# Patient Record
Sex: Female | Born: 1955
Health system: Southern US, Community
[De-identification: ages and names within clinical notes are randomized; demographics above are authoritative.]

## PROBLEM LIST (undated history)

## (undated) DIAGNOSIS — H409 Unspecified glaucoma: Secondary | ICD-10-CM

## (undated) DIAGNOSIS — E119 Type 2 diabetes mellitus without complications: Secondary | ICD-10-CM

## (undated) DIAGNOSIS — E66813 Obesity, class 3: Secondary | ICD-10-CM

## (undated) HISTORY — DX: Obesity, class 3: E66.813

## (undated) HISTORY — DX: Unspecified glaucoma: H40.9

## (undated) HISTORY — PX: COLONOSCOPY: SHX174

## (undated) HISTORY — DX: Type 2 diabetes mellitus without complications: E11.9

## (undated) HISTORY — DX: Morbid (severe) obesity due to excess calories: E66.01

---

## 1987-03-08 HISTORY — PX: KNEE ARTHROSCOPY: SUR90

## 1987-03-08 HISTORY — PX: KNEE SURGERY: SHX244

## 1994-03-07 HISTORY — PX: ABDOMINAL HYSTERECTOMY: SHX81

## 2004-03-07 LAB — HM COLONOSCOPY

## 2010-09-23 ENCOUNTER — Other Ambulatory Visit: Payer: Self-pay | Admitting: Family Medicine

## 2010-09-23 DIAGNOSIS — Z1231 Encounter for screening mammogram for malignant neoplasm of breast: Secondary | ICD-10-CM

## 2010-10-08 ENCOUNTER — Ambulatory Visit: Payer: Self-pay

## 2010-10-15 ENCOUNTER — Ambulatory Visit
Admission: RE | Admit: 2010-10-15 | Discharge: 2010-10-15 | Disposition: A | Discharge status: Home / Self Care | Payer: PRIVATE HEALTH INSURANCE | Source: Ambulatory Visit | Attending: Family Medicine | Admitting: Family Medicine

## 2010-10-15 DIAGNOSIS — Z1231 Encounter for screening mammogram for malignant neoplasm of breast: Secondary | ICD-10-CM

## 2011-06-08 ENCOUNTER — Ambulatory Visit (INDEPENDENT_AMBULATORY_CARE_PROVIDER_SITE_OTHER): Payer: BC Managed Care – PPO | Admitting: Family Medicine

## 2011-06-08 ENCOUNTER — Encounter: Payer: Self-pay | Admitting: Family Medicine

## 2011-06-08 DIAGNOSIS — H9319 Tinnitus, unspecified ear: Secondary | ICD-10-CM

## 2011-06-08 DIAGNOSIS — R7309 Other abnormal glucose: Secondary | ICD-10-CM

## 2011-06-08 DIAGNOSIS — Z0000 Encounter for general adult medical examination without abnormal findings: Secondary | ICD-10-CM

## 2011-06-08 DIAGNOSIS — R739 Hyperglycemia, unspecified: Secondary | ICD-10-CM

## 2011-06-08 DIAGNOSIS — E669 Obesity, unspecified: Secondary | ICD-10-CM

## 2011-06-08 NOTE — Patient Instructions (Signed)
Preventive Care for Adults, Female A healthy lifestyle and preventive care can promote health and wellness. Preventive health guidelines for women include the following key practices.  A routine yearly physical is a good way to check with your caregiver about your health and preventive screening. It is a chance to share any concerns and updates on your health, and to receive a thorough exam.   Visit your dentist for a routine exam and preventive care every 6 months. Brush your teeth twice a day and floss once a day. Good oral hygiene prevents tooth decay and gum disease.   The frequency of eye exams is based on your age, health, family medical history, use of contact lenses, and other factors. Follow your caregiver's recommendations for frequency of eye exams.   Eat a healthy diet. Foods like vegetables, fruits, whole grains, low-fat dairy products, and lean protein foods contain the nutrients you need without too many calories. Decrease your intake of foods high in solid fats, added sugars, and salt. Eat the right amount of calories for you.Get information about a proper diet from your caregiver, if necessary.   Regular physical exercise is one of the most important things you can do for your health. Most adults should get at least 150 minutes of moderate-intensity exercise (any activity that increases your heart rate and causes you to sweat) each week. In addition, most adults need muscle-strengthening exercises on 2 or more days a week.   Maintain a healthy weight. The body mass index (BMI) is a screening tool to identify possible weight problems. It provides an estimate of body fat based on height and weight. Your caregiver can help determine your BMI, and can help you achieve or maintain a healthy weight.For adults 20 years and older:   A BMI below 18.5 is considered underweight.   A BMI of 18.5 to 24.9 is normal.   A BMI of 25 to 29.9 is considered overweight.   A BMI of 30 and above is  considered obese.   Maintain normal blood lipids and cholesterol levels by exercising and minimizing your intake of saturated fat. Eat a balanced diet with plenty of fruit and vegetables. Blood tests for lipids and cholesterol should begin at age 20 and be repeated every 5 years. If your lipid or cholesterol levels are high, you are over 50, or you are at high risk for heart disease, you may need your cholesterol levels checked more frequently.Ongoing high lipid and cholesterol levels should be treated with medicines if diet and exercise are not effective.   If you smoke, find out from your caregiver how to quit. If you do not use tobacco, do not start.   If you are pregnant, do not drink alcohol. If you are breastfeeding, be very cautious about drinking alcohol. If you are not pregnant and choose to drink alcohol, do not exceed 1 drink per day. One drink is considered to be 12 ounces (355 mL) of beer, 5 ounces (148 mL) of wine, or 1.5 ounces (44 mL) of liquor.   Avoid use of street drugs. Do not share needles with anyone. Ask for help if you need support or instructions about stopping the use of drugs.   High blood pressure causes heart disease and increases the risk of stroke. Your blood pressure should be checked at least every 1 to 2 years. Ongoing high blood pressure should be treated with medicines if weight loss and exercise are not effective.   If you are 55 to 58   years old, ask your caregiver if you should take aspirin to prevent strokes.   Diabetes screening involves taking a blood sample to check your fasting blood sugar level. This should be done once every 3 years, after age 45, if you are within normal weight and without risk factors for diabetes. Testing should be considered at a younger age or be carried out more frequently if you are overweight and have at least 1 risk factor for diabetes.   Breast cancer screening is essential preventive care for women. You should practice "breast  self-awareness." This means understanding the normal appearance and feel of your breasts and may include breast self-examination. Any changes detected, no matter how small, should be reported to a caregiver. Women in their 20s and 30s should have a clinical breast exam (CBE) by a caregiver as part of a regular health exam every 1 to 3 years. After age 40, women should have a CBE every year. Starting at age 40, women should consider having a mammography (breast X-ray test) every year. Women who have a family history of breast cancer should talk to their caregiver about genetic screening. Women at a high risk of breast cancer should talk to their caregivers about having magnetic resonance imaging (MRI) and a mammography every year.   The Pap test is a screening test for cervical cancer. A Pap test can show cell changes on the cervix that might become cervical cancer if left untreated. A Pap test is a procedure in which cells are obtained and examined from the lower end of the uterus (cervix).   Women should have a Pap test starting at age 21.   Between ages 21 and 29, Pap tests should be repeated every 2 years.   Beginning at age 30, you should have a Pap test every 3 years as long as the past 3 Pap tests have been normal.   Some women have medical problems that increase the chance of getting cervical cancer. Talk to your caregiver about these problems. It is especially important to talk to your caregiver if a new problem develops soon after your last Pap test. In these cases, your caregiver may recommend more frequent screening and Pap tests.   The above recommendations are the same for women who have or have not gotten the vaccine for human papillomavirus (HPV).   If you had a hysterectomy for a problem that was not cancer or a condition that could lead to cancer, then you no longer need Pap tests. Even if you no longer need a Pap test, a regular exam is a good idea to make sure no other problems are  starting.   If you are between ages 65 and 70, and you have had normal Pap tests going back 10 years, you no longer need Pap tests. Even if you no longer need a Pap test, a regular exam is a good idea to make sure no other problems are starting.   If you have had past treatment for cervical cancer or a condition that could lead to cancer, you need Pap tests and screening for cancer for at least 20 years after your treatment.   If Pap tests have been discontinued, risk factors (such as a new sexual partner) need to be reassessed to determine if screening should be resumed.   The HPV test is an additional test that may be used for cervical cancer screening. The HPV test looks for the virus that can cause the cell changes on the cervix.   The cells collected during the Pap test can be tested for HPV. The HPV test could be used to screen women aged 30 years and older, and should be used in women of any age who have unclear Pap test results. After the age of 30, women should have HPV testing at the same frequency as a Pap test.   Colorectal cancer can be detected and often prevented. Most routine colorectal cancer screening begins at the age of 50 and continues through age 75. However, your caregiver may recommend screening at an earlier age if you have risk factors for colon cancer. On a yearly basis, your caregiver may provide home test kits to check for hidden blood in the stool. Use of a small camera at the end of a tube, to directly examine the colon (sigmoidoscopy or colonoscopy), can detect the earliest forms of colorectal cancer. Talk to your caregiver about this at age 50, when routine screening begins. Direct examination of the colon should be repeated every 5 to 10 years through age 75, unless early forms of pre-cancerous polyps or small growths are found.   Hepatitis C blood testing is recommended for all people born from 1945 through 1965 and any individual with known risks for hepatitis C.    Practice safe sex. Use condoms and avoid high-risk sexual practices to reduce the spread of sexually transmitted infections (STIs). STIs include gonorrhea, chlamydia, syphilis, trichomonas, herpes, HPV, and human immunodeficiency virus (HIV). Herpes, HIV, and HPV are viral illnesses that have no cure. They can result in disability, cancer, and death. Sexually active women aged 25 and younger should be checked for chlamydia. Older women with new or multiple partners should also be tested for chlamydia. Testing for other STIs is recommended if you are sexually active and at increased risk.   Osteoporosis is a disease in which the bones lose minerals and strength with aging. This can result in serious bone fractures. The risk of osteoporosis can be identified using a bone density scan. Women ages 65 and over and women at risk for fractures or osteoporosis should discuss screening with their caregivers. Ask your caregiver whether you should take a calcium supplement or vitamin D to reduce the rate of osteoporosis.   Menopause can be associated with physical symptoms and risks. Hormone replacement therapy is available to decrease symptoms and risks. You should talk to your caregiver about whether hormone replacement therapy is right for you.   Use sunscreen with sun protection factor (SPF) of 30 or more. Apply sunscreen liberally and repeatedly throughout the day. You should seek shade when your shadow is shorter than you. Protect yourself by wearing long sleeves, pants, a wide-brimmed hat, and sunglasses year round, whenever you are outdoors.   Once a month, do a whole body skin exam, using a mirror to look at the skin on your back. Notify your caregiver of new moles, moles that have irregular borders, moles that are larger than a pencil eraser, or moles that have changed in shape or color.   Stay current with required immunizations.   Influenza. You need a dose every fall (or winter). The composition of  the flu vaccine changes each year, so being vaccinated once is not enough.   Pneumococcal polysaccharide. You need 1 to 2 doses if you smoke cigarettes or if you have certain chronic medical conditions. You need 1 dose at age 65 (or older) if you have never been vaccinated.   Tetanus, diphtheria, pertussis (Tdap, Td). Get 1 dose of   Tdap vaccine if you are younger than age 65, are over 65 and have contact with an infant, are a healthcare worker, are pregnant, or simply want to be protected from whooping cough. After that, you need a Td booster dose every 10 years. Consult your caregiver if you have not had at least 3 tetanus and diphtheria-containing shots sometime in your life or have a deep or dirty wound.   HPV. You need this vaccine if you are a woman age 26 or younger. The vaccine is given in 3 doses over 6 months.   Measles, mumps, rubella (MMR). You need at least 1 dose of MMR if you were born in 1957 or later. You may also need a second dose.   Meningococcal. If you are age 19 to 21 and a first-year college student living in a residence hall, or have one of several medical conditions, you need to get vaccinated against meningococcal disease. You may also need additional booster doses.   Zoster (shingles). If you are age 60 or older, you should get this vaccine.   Varicella (chickenpox). If you have never had chickenpox or you were vaccinated but received only 1 dose, talk to your caregiver to find out if you need this vaccine.   Hepatitis A. You need this vaccine if you have a specific risk factor for hepatitis A virus infection or you simply wish to be protected from this disease. The vaccine is usually given as 2 doses, 6 to 18 months apart.   Hepatitis B. You need this vaccine if you have a specific risk factor for hepatitis B virus infection or you simply wish to be protected from this disease. The vaccine is given in 3 doses, usually over 6 months.  Preventive Services /  Frequency Ages 19 to 39  Blood pressure check.** / Every 1 to 2 years.   Lipid and cholesterol check.** / Every 5 years beginning at age 20.   Clinical breast exam.** / Every 3 years for women in their 20s and 30s.   Pap test.** / Every 2 years from ages 21 through 29. Every 3 years starting at age 30 through age 65 or 70 with a history of 3 consecutive normal Pap tests.   HPV screening.** / Every 3 years from ages 30 through ages 65 to 70 with a history of 3 consecutive normal Pap tests.   Hepatitis C blood test.** / For any individual with known risks for hepatitis C.   Skin self-exam. / Monthly.   Influenza immunization.** / Every year.   Pneumococcal polysaccharide immunization.** / 1 to 2 doses if you smoke cigarettes or if you have certain chronic medical conditions.   Tetanus, diphtheria, pertussis (Tdap, Td) immunization. / A one-time dose of Tdap vaccine. After that, you need a Td booster dose every 10 years.   HPV immunization. / 3 doses over 6 months, if you are 26 and younger.   Measles, mumps, rubella (MMR) immunization. / You need at least 1 dose of MMR if you were born in 1957 or later. You may also need a second dose.   Meningococcal immunization. / 1 dose if you are age 19 to 21 and a first-year college student living in a residence hall, or have one of several medical conditions, you need to get vaccinated against meningococcal disease. You may also need additional booster doses.   Varicella immunization.** / Consult your caregiver.   Hepatitis A immunization.** / Consult your caregiver. 2 doses, 6 to 18 months   apart.   Hepatitis B immunization.** / Consult your caregiver. 3 doses usually over 6 months.  Ages 40 to 64  Blood pressure check.** / Every 1 to 2 years.   Lipid and cholesterol check.** / Every 5 years beginning at age 20.   Clinical breast exam.** / Every year after age 40.   Mammogram.** / Every year beginning at age 40 and continuing for as  long as you are in good health. Consult with your caregiver.   Pap test.** / Every 3 years starting at age 30 through age 65 or 70 with a history of 3 consecutive normal Pap tests.   HPV screening.** / Every 3 years from ages 30 through ages 65 to 70 with a history of 3 consecutive normal Pap tests.   Fecal occult blood test (FOBT) of stool. / Every year beginning at age 50 and continuing until age 75. You may not need to do this test if you get a colonoscopy every 10 years.   Flexible sigmoidoscopy or colonoscopy.** / Every 5 years for a flexible sigmoidoscopy or every 10 years for a colonoscopy beginning at age 50 and continuing until age 75.   Hepatitis C blood test.** / For all people born from 1945 through 1965 and any individual with known risks for hepatitis C.   Skin self-exam. / Monthly.   Influenza immunization.** / Every year.   Pneumococcal polysaccharide immunization.** / 1 to 2 doses if you smoke cigarettes or if you have certain chronic medical conditions.   Tetanus, diphtheria, pertussis (Tdap, Td) immunization.** / A one-time dose of Tdap vaccine. After that, you need a Td booster dose every 10 years.   Measles, mumps, rubella (MMR) immunization. / You need at least 1 dose of MMR if you were born in 1957 or later. You may also need a second dose.   Varicella immunization.** / Consult your caregiver.   Meningococcal immunization.** / Consult your caregiver.   Hepatitis A immunization.** / Consult your caregiver. 2 doses, 6 to 18 months apart.   Hepatitis B immunization.** / Consult your caregiver. 3 doses, usually over 6 months.  Ages 65 and over  Blood pressure check.** / Every 1 to 2 years.   Lipid and cholesterol check.** / Every 5 years beginning at age 20.   Clinical breast exam.** / Every year after age 40.   Mammogram.** / Every year beginning at age 40 and continuing for as long as you are in good health. Consult with your caregiver.   Pap test.** /  Every 3 years starting at age 30 through age 65 or 70 with a 3 consecutive normal Pap tests. Testing can be stopped between 65 and 70 with 3 consecutive normal Pap tests and no abnormal Pap or HPV tests in the past 10 years.   HPV screening.** / Every 3 years from ages 30 through ages 65 or 70 with a history of 3 consecutive normal Pap tests. Testing can be stopped between 65 and 70 with 3 consecutive normal Pap tests and no abnormal Pap or HPV tests in the past 10 years.   Fecal occult blood test (FOBT) of stool. / Every year beginning at age 50 and continuing until age 75. You may not need to do this test if you get a colonoscopy every 10 years.   Flexible sigmoidoscopy or colonoscopy.** / Every 5 years for a flexible sigmoidoscopy or every 10 years for a colonoscopy beginning at age 50 and continuing until age 75.   Hepatitis   C blood test.** / For all people born from 1945 through 1965 and any individual with known risks for hepatitis C.   Osteoporosis screening.** / A one-time screening for women ages 65 and over and women at risk for fractures or osteoporosis.   Skin self-exam. / Monthly.   Influenza immunization.** / Every year.   Pneumococcal polysaccharide immunization.** / 1 dose at age 65 (or older) if you have never been vaccinated.   Tetanus, diphtheria, pertussis (Tdap, Td) immunization. / A one-time dose of Tdap vaccine if you are over 65 and have contact with an infant, are a healthcare worker, or simply want to be protected from whooping cough. After that, you need a Td booster dose every 10 years.   Varicella immunization.** / Consult your caregiver.   Meningococcal immunization.** / Consult your caregiver.   Hepatitis A immunization.** / Consult your caregiver. 2 doses, 6 to 18 months apart.   Hepatitis B immunization.** / Check with your caregiver. 3 doses, usually over 6 months.  ** Family history and personal history of risk and conditions may change your caregiver's  recommendations. Document Released: 04/19/2001 Document Revised: 02/10/2011 Document Reviewed: 07/19/2010 [employer] Patient Information 2012 [employer]. 

## 2011-06-14 ENCOUNTER — Other Ambulatory Visit: Payer: BC Managed Care – PPO

## 2011-06-14 ENCOUNTER — Encounter: Payer: Self-pay | Admitting: Family Medicine

## 2011-06-14 DIAGNOSIS — Z0000 Encounter for general adult medical examination without abnormal findings: Secondary | ICD-10-CM | POA: Insufficient documentation

## 2011-06-14 DIAGNOSIS — H9319 Tinnitus, unspecified ear: Secondary | ICD-10-CM | POA: Insufficient documentation

## 2011-06-14 NOTE — Assessment & Plan Note (Signed)
Agrees to release of records, fasting labs and will return for gyn exam at last visit.

## 2011-06-14 NOTE — Assessment & Plan Note (Signed)
Check a renal panel and hgba1c, avoid simple carbs

## 2011-06-14 NOTE — Assessment & Plan Note (Signed)
Encouraged DASH diet and small, frequent meals. Continue [employer]

## 2011-06-14 NOTE — Assessment & Plan Note (Signed)
Has just begun denies any trauma or recent febrile illness, mild cold symptoms noted in past couple of months. Patient will see if symptoms resolved before next visit if they do not will need further work up and referral

## 2011-06-14 NOTE — Progress Notes (Signed)
Sheri Liu [id number] [birth year] 06/14/2011      Progress Note New Patient  Subjective  Chief Complaint  Chief Complaint  Patient presents with  . Establish Care    new patient    HPI  Patient is a 56 year old African American female who is in today to establish care. She is relocating to [place]. His overall good health but she has noted 1-2 week history of sudden onset tinnitus. Occurring in both ears. She denies any head trauma or recent febrile illness. Has had some mild cold symptoms intermittently but these are resolved. Denies any history of anything similar. No hearing loss. No other neurologic complaints. She has joint and is going to book him for exercise but is still frustrated regarding weight gain. Otherwise she reports she's in good health she works from home for [employer]. Has taken no flu shots available.  Past Medical History  Diagnosis Date  . Measles as a child  . Obesity   . Hyperglycemia   . Tinnitus 06/14/2011  . Preventative health care 06/14/2011    Past Surgical History  Procedure Date  . Knee surgery 1989    left, arthroscopy to clean up meniscus  . Abdominal hysterectomy 1996    partial- still has ovaries, for fibroids, heavy bleeding    Family History  Problem Relation Age of Onset  . Diabetes Mother 70    type 2  . Heart disease Mother     pacemaker  . Obesity Mother   . Cancer Father 2    stomach  . Alcohol abuse Brother   . Diabetes Maternal Grandmother     type 2  . Heart disease Maternal Grandmother     pacer  . Diabetes Brother 15    History   Social History  . Marital Status: Married    Spouse Name: N/A    Number of Children: N/A  . Years of Education: N/A   Occupational History  . Not on file.   Social History Main Topics  . Smoking status: Never Smoker   . Smokeless tobacco: Never Used  . Alcohol Use: No  . Drug Use: No  . Sexually Active: Yes -- Female partner(s)   Other Topics Concern  . Not on file    Social History Narrative  . No narrative on file    No current outpatient prescriptions on file prior to visit.    No Known Allergies  Review of Systems  Review of Systems  Constitutional: Negative for fever, chills and malaise/fatigue.  HENT: Positive for tinnitus. Negative for hearing loss, ear pain, nosebleeds, congestion and ear discharge.   Eyes: Negative for discharge.  Respiratory: Negative for cough, sputum production, shortness of breath and wheezing.   Cardiovascular: Negative for chest pain, palpitations and leg swelling.  Gastrointestinal: Negative for heartburn, nausea, vomiting, abdominal pain, diarrhea, constipation and blood in stool.  Genitourinary: Negative for dysuria, urgency, frequency and hematuria.  Musculoskeletal: Negative for myalgias, back pain and falls.  Skin: Negative for rash.  Neurological: Negative for dizziness, tremors, sensory change, focal weakness, loss of consciousness, weakness and headaches.  Endo/Heme/Allergies: Negative for polydipsia. Does not bruise/bleed easily.  Psychiatric/Behavioral: Negative for depression and suicidal ideas. The patient is not nervous/anxious and does not have insomnia.     Objective  BP 145/88  Pulse 85  Temp(Src) 97.8 F (36.6 C) (Temporal)  Ht 5\' 5"  (1.651 m)  Wt 265 lb 1.9 oz (120.258 kg)  BMI 44.12 kg/m2  SpO2 99%  Physical Exam  Physical Exam  Constitutional: She is oriented to person, place, and time and well-developed, well-nourished, and in no distress. No distress.  HENT:  Head: Normocephalic and atraumatic.  Right Ear: External ear normal.  Left Ear: External ear normal.  Nose: Nose normal.  Mouth/Throat: Oropharynx is clear and moist. No oropharyngeal exudate.  Eyes: Conjunctivae are normal. Pupils are equal, round, and reactive to light. Right eye exhibits no discharge. Left eye exhibits no discharge. No scleral icterus.  Neck: Normal range of motion. Neck supple. No thyromegaly  present.  Cardiovascular: Normal rate, regular rhythm, normal heart sounds and intact distal pulses.   No murmur heard. Pulmonary/Chest: Effort normal and breath sounds normal. No respiratory distress. She has no wheezes. She has no rales.  Abdominal: Soft. Bowel sounds are normal. She exhibits no distension and no mass. There is no tenderness.  Musculoskeletal: Normal range of motion. She exhibits no edema and no tenderness.  Lymphadenopathy:    She has no cervical adenopathy.  Neurological: She is alert and oriented to person, place, and time. She has normal reflexes. No cranial nerve deficit. Coordination normal.  Skin: Skin is warm and dry. No rash noted. She is not diaphoretic.  Psychiatric: Mood, memory and affect normal.       Assessment & Plan  Preventative health care Agrees to release of records, fasting labs and will return for gyn exam at last visit.   Tinnitus Has just begun denies any trauma or recent febrile illness, mild cold symptoms noted in past couple of months. Patient will see if symptoms resolved before next visit if they do not will need further work up and referral  Hyperglycemia Check a renal panel and hgba1c, avoid simple carbs  Obesity Encouraged DASH diet and small, frequent meals. Continue [employer]

## 2011-07-06 ENCOUNTER — Ambulatory Visit: Payer: BC Managed Care – PPO | Admitting: Family Medicine

## 2011-07-08 ENCOUNTER — Other Ambulatory Visit (INDEPENDENT_AMBULATORY_CARE_PROVIDER_SITE_OTHER): Payer: BC Managed Care – PPO

## 2011-07-08 DIAGNOSIS — Z0000 Encounter for general adult medical examination without abnormal findings: Secondary | ICD-10-CM

## 2011-07-08 LAB — RENAL FUNCTION PANEL
Albumin: 4.1 g/dL (ref 3.5–5.2)
BUN: 18 mg/dL (ref 6–23)
CO2: 26 mEq/L (ref 19–32)
Chloride: 107 mEq/L (ref 96–112)
GFR: 82.71 mL/min (ref >60.00)
Phosphorus: 3.4 mg/dL (ref 2.3–4.6)
Potassium: 4.6 mEq/L (ref 3.5–5.1)

## 2011-07-08 LAB — LIPID PANEL
Cholesterol: 175 mg/dL (ref 0–200)
HDL: 59 mg/dL (ref >39.00)
LDL Cholesterol: 100 mg/dL — ABNORMAL HIGH (ref 0–99)
Total CHOL/HDL Ratio: 3
Triglycerides: 78 mg/dL (ref 0.0–149.0)

## 2011-07-08 LAB — CBC
MCHC: 32.3 g/dL (ref 30.0–36.0)
MCV: 90.8 fl (ref 78.0–100.0)
RDW: 15.2 % — ABNORMAL HIGH (ref 11.5–14.6)

## 2011-07-08 LAB — HEPATIC FUNCTION PANEL
Alkaline Phosphatase: 75 U/L (ref 39–117)
Bilirubin, Direct: 0 mg/dL (ref 0.0–0.3)
Total Bilirubin: 0.5 mg/dL (ref 0.3–1.2)

## 2011-07-22 ENCOUNTER — Ambulatory Visit: Payer: BC Managed Care – PPO | Admitting: Family Medicine

## 2011-07-26 ENCOUNTER — Ambulatory Visit (INDEPENDENT_AMBULATORY_CARE_PROVIDER_SITE_OTHER): Payer: BC Managed Care – PPO | Admitting: Family Medicine

## 2011-07-26 ENCOUNTER — Encounter: Payer: Self-pay | Admitting: Family Medicine

## 2011-07-26 VITALS — BP 131/82 | HR 84 | Temp 98.6°F | Ht 65.0 in | Wt 266.0 lb

## 2011-07-26 DIAGNOSIS — H9319 Tinnitus, unspecified ear: Secondary | ICD-10-CM

## 2011-07-26 DIAGNOSIS — R7309 Other abnormal glucose: Secondary | ICD-10-CM

## 2011-07-26 DIAGNOSIS — E669 Obesity, unspecified: Secondary | ICD-10-CM

## 2011-07-26 DIAGNOSIS — R739 Hyperglycemia, unspecified: Secondary | ICD-10-CM

## 2011-07-26 DIAGNOSIS — Z0000 Encounter for general adult medical examination without abnormal findings: Secondary | ICD-10-CM

## 2011-07-26 NOTE — Assessment & Plan Note (Signed)
Resolved, patient reports complete resolution

## 2011-07-26 NOTE — Patient Instructions (Signed)

## 2011-07-26 NOTE — Progress Notes (Signed)
Patient ID: Sheri Liu, female   DOB: 1955-05-01, 56 y.o.   MRN: [id number] Sheri Liu [id number] 12/05/1955 07/26/2011      Progress Note-Follow Up  Subjective  Chief Complaint  Chief Complaint  Patient presents with  . Follow-up    HPI  Patient is a 56 year old [place] American female who is in today for followup on patient appointment. She is pleased that her tinnitus has resolved. She does note that did resolve about time she restarted her Krill oil. No hearing loss or other neurologic complaints today. Overall she feels well. She started doing boot can't 3 days a week. She's decreased her by mouth intake and given up pork and beef. She's had no acute illness, fevers, chills, chest pain, palpitations, GI or GU complaints since last seen  Past Medical History  Diagnosis Date  . Measles as a child  . Obesity   . Hyperglycemia   . Tinnitus 06/14/2011  . Preventative health care 06/14/2011    Past Surgical History  Procedure Date  . Knee surgery 1989    left, arthroscopy to clean up meniscus  . Abdominal hysterectomy 1996    partial- still has ovaries, for fibroids, heavy bleeding    Family History  Problem Relation Age of Onset  . Diabetes Mother 22    type 2  . Heart disease Mother     pacemaker  . Obesity Mother   . Cancer Father 69    stomach  . Alcohol abuse Brother   . Diabetes Maternal Grandmother     type 2  . Heart disease Maternal Grandmother     pacer  . Diabetes Brother 65    History   Social History  . Marital Status: Married    Spouse Name: N/A    Number of Children: N/A  . Years of Education: N/A   Occupational History  . Not on file.   Social History Main Topics  . Smoking status: Never Smoker   . Smokeless tobacco: Never Used  . Alcohol Use: No  . Drug Use: No  . Sexually Active: Yes -- Female partner(s)   Other Topics Concern  . Not on file   Social History Narrative  . No narrative on file    Current Outpatient  Prescriptions on File Prior to Visit  Medication Sig Dispense Refill  . cholecalciferol (VITAMIN D) 1000 UNITS tablet Take 1,000 Units by mouth daily.      . Multiple Vitamins-Minerals (MULTIVITAMIN PO) Take 1 tablet by mouth daily.        No Known Allergies  Review of Systems  Review of Systems  Constitutional: Negative for fever and malaise/fatigue.  HENT: Negative for congestion.   Eyes: Negative for discharge.  Respiratory: Negative for shortness of breath.   Cardiovascular: Negative for chest pain, palpitations and leg swelling.  Gastrointestinal: Negative for nausea, abdominal pain and diarrhea.  Genitourinary: Negative for dysuria.  Musculoskeletal: Negative for falls.  Skin: Negative for rash.  Neurological: Negative for loss of consciousness and headaches.  Endo/Heme/Allergies: Negative for polydipsia.  Psychiatric/Behavioral: Negative for depression and suicidal ideas. The patient is not nervous/anxious and does not have insomnia.     Objective  BP 131/82  Pulse 84  Temp(Src) 98.6 F (37 C) (Temporal)  Ht 5\' 5"  (1.651 m)  Wt 266 lb (120.657 kg)  BMI 44.26 kg/m2  SpO2 95%  Physical Exam  Physical Exam  Constitutional: She is oriented to person, place, and time and well-developed, well-nourished, and in no  distress. No distress.  HENT:  Head: Normocephalic and atraumatic.  Eyes: Conjunctivae are normal.  Neck: Neck supple. No thyromegaly present.  Cardiovascular: Normal rate, regular rhythm and normal heart sounds.   No murmur heard. Pulmonary/Chest: Effort normal and breath sounds normal. She has no wheezes.  Abdominal: She exhibits no distension and no mass.  Musculoskeletal: She exhibits no edema.  Lymphadenopathy:    She has no cervical adenopathy.  Neurological: She is alert and oriented to person, place, and time.  Skin: Skin is warm and dry. No rash noted. She is not diaphoretic.  Psychiatric: Memory, affect and judgment normal.    Lab Results    Component Value Date   TSH 2.17 07/08/2011   Lab Results  Component Value Date   WBC 7.0 07/08/2011   HGB 12.9 07/08/2011   HCT 40.0 07/08/2011   MCV 90.8 07/08/2011   PLT 224.0 07/08/2011   Lab Results  Component Value Date   CREATININE 0.9 07/08/2011   BUN 18 07/08/2011   NA 140 07/08/2011   K 4.6 07/08/2011   CL 107 07/08/2011   CO2 26 07/08/2011   Lab Results  Component Value Date   ALT 31 07/08/2011   AST 22 07/08/2011   ALKPHOS 75 07/08/2011   BILITOT 0.5 07/08/2011   Lab Results  Component Value Date   CHOL 175 07/08/2011   Lab Results  Component Value Date   HDL 59.00 07/08/2011   Lab Results  Component Value Date   LDLCALC 100* 07/08/2011   Lab Results  Component Value Date   TRIG 78.0 07/08/2011   Lab Results  Component Value Date   CHOLHDL 3 07/08/2011     Assessment & Plan  Obesity Is exercising better, doing boot camp 3 x a week. Is encouraged to follow DASH diet and needs to eat small frequent meals  Hyperglycemia Sugars good on lab work. Minimize simple carbs and continue to monitor  Tinnitus Resolved, patient reports complete resolution  Preventative health care Labs reviewed with patient today. Last pap was last year and negative, repeat in 1-2 more years unless symptoms occur. Patient will proceed with MGM some time after 10/15/2011. Takes flu shot every other year. Agrees to this year's flu shot. See her annually or as needed

## 2011-07-26 NOTE — Assessment & Plan Note (Signed)
Is exercising better, doing boot camp 3 x a week. Is encouraged to follow DASH diet and needs to eat small frequent meals

## 2011-07-26 NOTE — Assessment & Plan Note (Signed)
Labs reviewed with patient today. Last pap was last year and negative, repeat in 1-2 more years unless symptoms occur. Patient will proceed with MGM some time after 10/15/2011. Takes flu shot every other year. Agrees to this year's flu shot. See her annually or as needed

## 2011-07-26 NOTE — Assessment & Plan Note (Signed)
Sugars good on lab work. Minimize simple carbs and continue to monitor

## 2011-08-22 ENCOUNTER — Ambulatory Visit (INDEPENDENT_AMBULATORY_CARE_PROVIDER_SITE_OTHER): Payer: BC Managed Care – PPO | Admitting: Family Medicine

## 2011-08-22 ENCOUNTER — Encounter: Payer: Self-pay | Admitting: Family Medicine

## 2011-08-22 VITALS — BP 124/77 | HR 87 | Temp 97.8°F | Ht 66.0 in | Wt 255.0 lb

## 2011-08-22 DIAGNOSIS — M25562 Pain in left knee: Secondary | ICD-10-CM

## 2011-08-22 DIAGNOSIS — M25569 Pain in unspecified knee: Secondary | ICD-10-CM

## 2011-08-22 NOTE — Patient Instructions (Addendum)
Your knee pain is likely due to a flare-up of lateral arthritis (less likely a degenerative meniscal tear or IT band syndrome). Take tylenol 500mg  1-2 tabs three times a day for pain. Aleve 1-2 tabs twice a day with food (OR advil 3 tabs 3 times a day with food). Glucosamine sulfate 750mg  twice a day is a supplement that has been shown to help moderate to severe arthritis. Capsaicin topically up to four times a day may also help with pain. Cortisone injections are an option. If cortisone injections do not help, there are different types of shots that may help but they take longer to take effect. It's important that you continue to stay active. Focus on quad strengthening (straight leg raises and leg extensions 3 sets of 10) to help take pressure off the joint. Can consider formal physical therapy too. Heat or ice 15 minutes at a time 3-4 times a day as needed to help with pain. Water aerobics and cycling with low resistance are the best two types of exercise for arthritis. Follow up with me as needed.

## 2011-08-23 ENCOUNTER — Encounter: Payer: Self-pay | Admitting: Family Medicine

## 2011-08-23 DIAGNOSIS — M25562 Pain in left knee: Secondary | ICD-10-CM | POA: Insufficient documentation

## 2011-08-23 NOTE — Assessment & Plan Note (Signed)
given insidious onset, location, lateral joint line tenderness this is most likely due to a flare of DJD, less like degen meniscal tear or ITBS.  She would like to try conservative care first as the past couple days it has felt better than previously.  Discussed tylenol, aleve, glucosamine, capsaicin.  Offered to do x-rays as well but this would not change our initial management so deferred for now.  Shown quad strengthening exercises.  Heat or ice as needed (whichever feels better to her).  F/u prn.

## 2011-08-23 NOTE — Progress Notes (Signed)
Subjective:    Patient ID: Sheri Liu, female    DOB: August 15, 1955, 56 y.o.   MRN: [id number]  PCP: Dr. Abner Greenspan  HPI 56 yo F here for left knee pain.  Patient denies known injury. States she was recently in [place] for work about 3 weeks ago when this started. Began after walking up subway steps, rushing and wearing low heels. Intensified after about 2 miles of walking around [place] as well. Some swelling. Pops and has buckling. Pain is mostly lateral. Works from home. Using a heating pad, advil and tylenol. No prior left knee surgeries or injuries.  Has had right knee arthroscopic surgery.  Past Medical History  Diagnosis Date  . Measles as a child  . Obesity   . Hyperglycemia   . Tinnitus 06/14/2011  . Preventative health care 06/14/2011    Current Outpatient Prescriptions on File Prior to Visit  Medication Sig Dispense Refill  . cholecalciferol (VITAMIN D) 1000 UNITS tablet Take 1,000 Units by mouth daily.      . Multiple Vitamins-Minerals (MULTIVITAMIN PO) Take 1 tablet by mouth daily.        Past Surgical History  Procedure Date  . Knee surgery 1989    left, arthroscopy to clean up meniscus  . Abdominal hysterectomy 1996    partial- still has ovaries, for fibroids, heavy bleeding    No Known Allergies  History   Social History  . Marital Status: Married    Spouse Name: N/A    Number of Children: N/A  . Years of Education: N/A   Occupational History  . Not on file.   Social History Main Topics  . Smoking status: Never Smoker   . Smokeless tobacco: Never Used  . Alcohol Use: No  . Drug Use: No  . Sexually Active: Yes -- Female partner(s)   Other Topics Concern  . Not on file   Social History Narrative  . No narrative on file    Family History  Problem Relation Age of Onset  . Diabetes Mother 44    type 2  . Heart disease Mother     pacemaker  . Obesity Mother   . Hypertension Mother   . Cancer Father 72    stomach  . Alcohol abuse  Brother   . Diabetes Maternal Grandmother     type 2  . Heart disease Maternal Grandmother     pacer  . Diabetes Brother 93  . Heart attack Neg Hx   . Hyperlipidemia Neg Hx   . Sudden death Neg Hx     BP 124/77  Pulse 87  Temp 97.8 F (36.6 C) (Oral)  Ht 5\' 6"  (1.676 m)  Wt 255 lb (115.667 kg)  BMI 41.16 kg/m2  Review of Systems See HPI above.    Objective:   Physical Exam Gen: NAD  L knee: No gross deformity, ecchymoses, effusion. TTP lateral joint line.  Mild TTP lateral posterior patellar facet.  No medial joint line, other TTP about knee. FROM. Negative ant/post drawers. Negative valgus/varus testing. Negative lachmanns. Negative mcmurrays, apleys, patellar apprehension.  Mild pain with clarkes. NV intact distally.  R knee: Well healed surgical scars. FROM without pain or instability.    Assessment & Plan:  1. Left knee pain - given insidious onset, location, lateral joint line tenderness this is most likely due to a flare of DJD, less like degen meniscal tear or ITBS.  She would like to try conservative care first as the past couple  days it has felt better than previously.  Discussed tylenol, aleve, glucosamine, capsaicin.  Offered to do x-rays as well but this would not change our initial management so deferred for now.  Shown quad strengthening exercises.  Heat or ice as needed (whichever feels better to her).  F/u prn.

## 2011-11-03 ENCOUNTER — Other Ambulatory Visit: Payer: Self-pay | Admitting: Family Medicine

## 2011-11-03 DIAGNOSIS — Z1231 Encounter for screening mammogram for malignant neoplasm of breast: Secondary | ICD-10-CM

## 2011-11-18 ENCOUNTER — Ambulatory Visit: Payer: BC Managed Care – PPO

## 2012-01-06 DIAGNOSIS — H409 Unspecified glaucoma: Secondary | ICD-10-CM

## 2012-01-06 HISTORY — DX: Unspecified glaucoma: H40.9

## 2012-01-20 ENCOUNTER — Encounter: Payer: Self-pay | Admitting: Family Medicine

## 2012-01-20 ENCOUNTER — Ambulatory Visit (INDEPENDENT_AMBULATORY_CARE_PROVIDER_SITE_OTHER): Payer: BC Managed Care – PPO | Admitting: Family Medicine

## 2012-01-20 VITALS — BP 135/86 | HR 87 | Temp 98.1°F | Ht 65.0 in | Wt 268.1 lb

## 2012-01-20 DIAGNOSIS — R5383 Other fatigue: Secondary | ICD-10-CM

## 2012-01-20 DIAGNOSIS — R5381 Other malaise: Secondary | ICD-10-CM

## 2012-01-20 DIAGNOSIS — M25569 Pain in unspecified knee: Secondary | ICD-10-CM

## 2012-01-20 DIAGNOSIS — R7309 Other abnormal glucose: Secondary | ICD-10-CM

## 2012-01-20 DIAGNOSIS — E669 Obesity, unspecified: Secondary | ICD-10-CM

## 2012-01-20 DIAGNOSIS — R739 Hyperglycemia, unspecified: Secondary | ICD-10-CM

## 2012-01-20 DIAGNOSIS — H409 Unspecified glaucoma: Secondary | ICD-10-CM | POA: Insufficient documentation

## 2012-01-20 DIAGNOSIS — Z0000 Encounter for general adult medical examination without abnormal findings: Secondary | ICD-10-CM

## 2012-01-20 DIAGNOSIS — M1990 Unspecified osteoarthritis, unspecified site: Secondary | ICD-10-CM

## 2012-01-20 DIAGNOSIS — M25562 Pain in left knee: Secondary | ICD-10-CM

## 2012-01-20 LAB — CBC
HCT: 39.7 % (ref 36.0–46.0)
MCV: 90.4 fl (ref 78.0–100.0)
Platelets: 255 10*3/uL (ref 150.0–400.0)
RBC: 4.39 Mil/uL (ref 3.87–5.11)

## 2012-01-20 LAB — RENAL FUNCTION PANEL
Albumin: 4.1 g/dL (ref 3.5–5.2)
CO2: 28 mEq/L (ref 19–32)
Calcium: 9.3 mg/dL (ref 8.4–10.5)
Creatinine, Ser: 1 mg/dL (ref 0.4–1.2)
Glucose, Bld: 92 mg/dL (ref 70–99)

## 2012-01-20 MED ORDER — DICLOFENAC SODIUM 3 % TD GEL: TRANSDERMAL | Status: DC

## 2012-01-20 MED ORDER — PROBIOTIC PRODUCT PO CHEW: CHEWABLE_TABLET | ORAL | Status: DC

## 2012-01-20 MED ORDER — KRILL OIL PO CAPS: ORAL_CAPSULE | ORAL | Status: DC

## 2012-01-20 NOTE — Patient Instructions (Addendum)
Osteoarthritis Osteoarthritis is the most common form of arthritis. It is redness, soreness, and swelling (inflammation) affecting the cartilage. Cartilage acts as a cushion, covering the ends of bones where they meet to form a joint. CAUSES  Over time, the cartilage begins to wear away. This causes bone to rub on bone. This produces pain and stiffness in the affected joints. Factors that contribute to this problem are:  Excessive body weight.  Age.  Overuse of joints. SYMPTOMS   People with osteoarthritis usually experience joint pain, swelling, or stiffness.  Over time, the joint may lose its normal shape.  Small deposits of bone (osteophytes) may grow on the edges of the joint.  Bits of bone or cartilage can break off and float inside the joint space. This may cause more pain and damage.  Osteoarthritis can lead to depression, anxiety, feelings of helplessness, and limitations on daily activities. The most commonly affected joints are in the:  Ends of the fingers.  Thumbs.  Neck.  Lower back.  Knees.  Hips. DIAGNOSIS  Diagnosis is mostly based on your symptoms and exam. Tests may be helpful, including:  X-rays of the affected joint.  A computerized magnetic scan (MRI).  Blood tests to rule out other types of arthritis.  Joint fluid tests. This involves using a needle to draw fluid from the joint and examining the fluid under a microscope. TREATMENT  Goals of treatment are to control pain, improve joint function, maintain a normal body weight, and maintain a healthy lifestyle. Treatment approaches may include:  A prescribed exercise program with rest and joint relief.  Weight control with nutritional education.  Pain relief techniques such as:  Properly applied heat and cold.  Electric pulses delivered to nerve endings under the skin (transcutaneous electrical nerve stimulation, TENS).  Massage.  Certain supplements. Ask your caregiver before using any  supplements, especially in combination with prescribed drugs.  Medicines to control pain, such as:  Acetaminophen.  Nonsteroidal anti-inflammatory drugs (NSAIDs), such as naproxen.  Narcotic or central-acting agents, such as tramadol. This drug carries a risk of addiction and is generally prescribed for short-term use.  Corticosteroids. These can be given orally or as injection. This is a short-term treatment, not recommended for routine use.  Surgery to reposition the bones and relieve pain (osteotomy) or to remove loose pieces of bone and cartilage. Joint replacement may be needed in advanced states of osteoarthritis. HOME CARE INSTRUCTIONS  Your caregiver can recommend specific types of exercise. These may include:  Strengthening exercises. These are done to strengthen the muscles that support joints affected by arthritis. They can be performed with weights or with exercise bands to add resistance.  Aerobic activities. These are exercises, such as brisk walking or low-impact aerobics, that get your heart pumping. They can help keep your lungs and circulatory system in shape.  Range-of-motion activities. These keep your joints limber.  Balance and agility exercises. These help you maintain daily living skills. Learning about your condition and being actively involved in your care will help improve the course of your osteoarthritis. SEEK MEDICAL CARE IF:   You feel hot or your skin turns red.  You develop a rash in addition to your joint pain.  You have an oral temperature above 102 F (38.9 C). FOR MORE INFORMATION  National Institute of Arthritis and Musculoskeletal and Skin Diseases: www.niams.nih.gov National Institute on Aging: www.nia.nih.gov American College of Rheumatology: www.rheumatology.org Document Released: 02/21/2005 Document Revised: 05/16/2011 Document Reviewed: 06/04/2009 [employer] Patient Information 2013 [employer].  

## 2012-01-22 ENCOUNTER — Encounter: Payer: Self-pay | Admitting: Family Medicine

## 2012-01-22 NOTE — Assessment & Plan Note (Signed)
Attempt DASH diet and increase exercise.

## 2012-01-22 NOTE — Progress Notes (Signed)
Patient ID: Sheri Liu, female   DOB: 10-13-1955, 56 y.o.   MRN: [id number] Sheri Liu [id number] 12/02/1955 01/22/2012      Progress Note-Follow Up  Subjective  Chief Complaint  Chief Complaint  Patient presents with  . Follow-up  . Injections    flu shot    HPI  Patient is a 56 year old female who is in today for followup. Overall she's doing well. She continues to struggle with left knee pain. No swelling or warmth. She's just been accepted into a experimental trial at [facility] but has not started yet. Continues to struggle with occasional tinnitus but it is manageable. Has just been told that she has glaucoma as well but does not have any significant acute complaints. It is 'she has been laser would benefit. No chest pain or palpitations. No recent illness, fevers, chills or palpitations, shortness of breath, GI or GU complaints noted today  Past Medical History  Diagnosis Date  . Measles as a child  . Obesity   . Hyperglycemia   . Tinnitus 06/14/2011  . Preventative health care 06/14/2011  . Glaucoma 11-13    Past Surgical History  Procedure Date  . Knee surgery 1989    left, arthroscopy to clean up meniscus  . Abdominal hysterectomy 1996    partial- still has ovaries, for fibroids, heavy bleeding    Family History  Problem Relation Age of Onset  . Diabetes Mother 61    type 2  . Heart disease Mother     pacemaker  . Obesity Mother   . Hypertension Mother   . Cancer Father 9    stomach  . Alcohol abuse Brother   . Diabetes Maternal Grandmother     type 2  . Heart disease Maternal Grandmother     pacer  . Diabetes Brother 45  . Heart attack Neg Hx   . Hyperlipidemia Neg Hx   . Sudden death Neg Hx     History   Social History  . Marital Status: Married    Spouse Name: N/A    Number of Children: N/A  . Years of Education: N/A   Occupational History  . Not on file.   Social History Main Topics  . Smoking status: Never Smoker     . Smokeless tobacco: Never Used  . Alcohol Use: No  . Drug Use: No  . Sexually Active: Yes -- Female partner(s)   Other Topics Concern  . Not on file   Social History Narrative  . No narrative on file    Current Outpatient Prescriptions on File Prior to Visit  Medication Sig Dispense Refill  . cholecalciferol (VITAMIN D) 1000 UNITS tablet Take 1,000 Units by mouth daily.      . Multiple Vitamins-Minerals (MULTIVITAMIN PO) Take 1 tablet by mouth daily.        No Known Allergies  Review of Systems  Review of Systems  Constitutional: Negative for fever and malaise/fatigue.  HENT: Negative for congestion.   Eyes: Negative for discharge.  Respiratory: Negative for shortness of breath.   Cardiovascular: Negative for chest pain, palpitations and leg swelling.  Gastrointestinal: Negative for nausea, abdominal pain and diarrhea.  Genitourinary: Negative for dysuria.  Musculoskeletal: Negative for falls.  Skin: Negative for rash.  Neurological: Negative for loss of consciousness and headaches.  Endo/Heme/Allergies: Negative for polydipsia.  Psychiatric/Behavioral: Negative for depression and suicidal ideas. The patient is not nervous/anxious and does not have insomnia.     Objective  BP 135/86  Pulse 87  Temp 98.1 F (36.7 C) (Temporal)  Ht 5\' 5"  (1.651 m)  Wt 268 lb 1.9 oz (121.618 kg)  BMI 44.62 kg/m2  SpO2 100%  Physical Exam  Physical Exam  Constitutional: She is oriented to person, place, and time and well-developed, well-nourished, and in no distress. No distress.  HENT:  Head: Normocephalic and atraumatic.  Eyes: Conjunctivae normal are normal.  Neck: Neck supple. No thyromegaly present.  Cardiovascular: Normal rate, regular rhythm and normal heart sounds.   No murmur heard. Pulmonary/Chest: Effort normal and breath sounds normal. She has no wheezes.  Abdominal: She exhibits no distension and no mass.  Musculoskeletal: She exhibits no edema.  Lymphadenopathy:     She has no cervical adenopathy.  Neurological: She is alert and oriented to person, place, and time.  Skin: Skin is warm and dry. No rash noted. She is not diaphoretic.  Psychiatric: Memory, affect and judgment normal.    Lab Results  Component Value Date   TSH 2.17 07/08/2011   Lab Results  Component Value Date   WBC 7.8 01/20/2012   HGB 13.0 01/20/2012   HCT 39.7 01/20/2012   MCV 90.4 01/20/2012   PLT 255.0 01/20/2012   Lab Results  Component Value Date   CREATININE 1.0 01/20/2012   BUN 17 01/20/2012   NA 136 01/20/2012   K 4.0 01/20/2012   CL 104 01/20/2012   CO2 28 01/20/2012   Lab Results  Component Value Date   ALT 31 07/08/2011   AST 22 07/08/2011   ALKPHOS 75 07/08/2011   BILITOT 0.5 07/08/2011   Lab Results  Component Value Date   CHOL 175 07/08/2011   Lab Results  Component Value Date   HDL 59.00 07/08/2011   Lab Results  Component Value Date   LDLCALC 100* 07/08/2011   Lab Results  Component Value Date   TRIG 78.0 07/08/2011   Lab Results  Component Value Date   CHOLHDL 3 07/08/2011     Assessment & Plan  Hyperglycemia Renal panel wnl today minimize simple carbs.  Obesity Attempt DASH diet and increase exercise.   Glaucoma Following with [facility]  Left knee pain Has entered a study at [facility] and has just found out that she was placed in the exercise cohort so is pleased is given an rx for a topical treatment from [facility] to use OA version of gel until the study actively starts.

## 2012-01-22 NOTE — Assessment & Plan Note (Signed)
Renal panel wnl today minimize simple carbs.

## 2012-01-23 NOTE — Assessment & Plan Note (Signed)
Has entered a study at [facility] and has just found out that she was placed in the exercise cohort so is pleased is given an rx for a topical treatment from [facility] to use OA version of gel until the study actively starts.

## 2012-01-23 NOTE — Assessment & Plan Note (Signed)
Following with [facility]

## 2012-02-10 ENCOUNTER — Ambulatory Visit
Admission: RE | Admit: 2012-02-10 | Discharge: 2012-02-10 | Disposition: A | Discharge status: Home / Self Care | Payer: BC Managed Care – PPO | Source: Ambulatory Visit | Attending: Family Medicine | Admitting: Family Medicine

## 2012-02-10 DIAGNOSIS — Z1231 Encounter for screening mammogram for malignant neoplasm of breast: Secondary | ICD-10-CM

## 2012-04-21 ENCOUNTER — Other Ambulatory Visit: Payer: Self-pay

## 2012-07-19 ENCOUNTER — Other Ambulatory Visit: Payer: BC Managed Care – PPO

## 2012-07-26 ENCOUNTER — Encounter: Payer: Self-pay | Admitting: Family Medicine

## 2012-07-26 ENCOUNTER — Encounter: Payer: BC Managed Care – PPO | Admitting: Family Medicine

## 2012-07-26 ENCOUNTER — Ambulatory Visit (INDEPENDENT_AMBULATORY_CARE_PROVIDER_SITE_OTHER): Payer: BC Managed Care – PPO | Admitting: Family Medicine

## 2012-07-26 VITALS — BP 118/70 | HR 79 | Temp 98.0°F | Ht 66.0 in | Wt 258.5 lb

## 2012-07-26 DIAGNOSIS — E669 Obesity, unspecified: Secondary | ICD-10-CM

## 2012-07-26 DIAGNOSIS — Z0000 Encounter for general adult medical examination without abnormal findings: Secondary | ICD-10-CM

## 2012-07-26 DIAGNOSIS — M25569 Pain in unspecified knee: Secondary | ICD-10-CM

## 2012-07-26 DIAGNOSIS — M25562 Pain in left knee: Secondary | ICD-10-CM

## 2012-07-26 NOTE — Patient Instructions (Addendum)
Annual exam with pap, labs prior to visit lipid, renal, cbc, hepatic, tsh  DASH Diet The DASH diet stands for "Dietary Approaches to Stop Hypertension." It is a healthy eating plan that has been shown to reduce high blood pressure (hypertension) in as little as 14 days, while also possibly providing other significant health benefits. These other health benefits include reducing the risk of breast cancer after menopause and reducing the risk of type 2 diabetes, heart disease, colon cancer, and stroke. Health benefits also include weight loss and slowing kidney failure in patients with chronic kidney disease.  DIET GUIDELINES  Limit salt (sodium). Your diet should contain less than 1500 mg of sodium daily.  Limit refined or processed carbohydrates. Your diet should include mostly whole grains. Desserts and added sugars should be used sparingly.  Include small amounts of heart-healthy fats. These types of fats include nuts, oils, and tub margarine. Limit saturated and trans fats. These fats have been shown to be harmful in the body. CHOOSING FOODS  The following food groups are based on a 2000 calorie diet. See your Registered Dietitian for individual calorie needs. Grains and Grain Products (6 to 8 servings daily)  Eat More Often: Whole-wheat bread, brown rice, whole-grain or wheat pasta, quinoa, popcorn without added fat or salt (air popped).  Eat Less Often: White bread, white pasta, white rice, cornbread. Vegetables (4 to 5 servings daily)  Eat More Often: Fresh, frozen, and canned vegetables. Vegetables may be raw, steamed, roasted, or grilled with a minimal amount of fat.  Eat Less Often/Avoid: Creamed or fried vegetables. Vegetables in a cheese sauce. Fruit (4 to 5 servings daily)  Eat More Often: All fresh, canned (in natural juice), or frozen fruits. Dried fruits without added sugar. One hundred percent fruit juice ( cup [237 mL] daily).  Eat Less Often: Dried fruits with added  sugar. Canned fruit in light or heavy syrup. [employer], Fish, and Poultry (2 servings or less daily. One serving is 3 to 4 oz [85-114 g]).  Eat More Often: Ninety percent or leaner ground beef, tenderloin, sirloin. Round cuts of beef, chicken breast, [place] breast. All fish. Grill, bake, or broil your meat. Nothing should be fried.  Eat Less Often/Avoid: Fatty cuts of meat, [place], or chicken leg, thigh, or wing. Fried cuts of meat or fish. Dairy (2 to 3 servings)  Eat More Often: Low-fat or fat-free milk, low-fat plain or light yogurt, reduced-fat or part-skim cheese.  Eat Less Often/Avoid: Milk (whole, 2%).Whole milk yogurt. Full-fat cheeses. Nuts, Seeds, and Legumes (4 to 5 servings per week)  Eat More Often: All without added salt.  Eat Less Often/Avoid: Salted nuts and seeds, canned beans with added salt. Fats and Sweets (limited)  Eat More Often: Vegetable oils, tub margarines without trans fats, sugar-free gelatin. Mayonnaise and salad dressings.  Eat Less Often/Avoid: Coconut oils, palm oils, butter, stick margarine, cream, half and half, cookies, candy, pie. FOR MORE INFORMATION The Dash Diet Eating Plan: www.dashdiet.org Document Released: 02/10/2011 Document Revised: 05/16/2011 Document Reviewed: 02/10/2011 [facility] Patient Information 2014 [place], [place].

## 2012-07-27 ENCOUNTER — Encounter: Payer: BC Managed Care – PPO | Admitting: Family Medicine

## 2012-07-31 NOTE — Assessment & Plan Note (Signed)
Maintain activity as tolerated. Continue krill oil caps

## 2012-07-31 NOTE — Assessment & Plan Note (Signed)
Encouraged DASH diet, increase exercise. Continues in a study at [facility], encouraged 8 hours of sleep nightly.

## 2012-07-31 NOTE — Progress Notes (Signed)
Patient ID: Sheri Liu, female   DOB: 1955-09-09, 57 y.o.   MRN: [id number] Sheri Liu [id number] [birth year] 07/31/2012      Progress Note-Follow Up  Subjective  Chief Complaint  Chief Complaint  Patient presents with  . Annual Exam    pap    HPI  Patient is a 57 year old female in today for annual exam. Doing well, continues to struggle with arthritis and left knee pain. Following with a research study at [facility]. No recent illness, fevers, headaches, CP, palp, SOB, GI or GU c/o. Taking eye drops routinely but no other meds.   Past Medical History  Diagnosis Date  . Measles as a child  . Obesity   . Hyperglycemia   . Tinnitus 06/14/2011  . Preventative health care 06/14/2011  . Glaucoma 11-13    Past Surgical History  Procedure Laterality Date  . Knee surgery  1989    left, arthroscopy to clean up meniscus  . Abdominal hysterectomy  1996    partial- still has ovaries, for fibroids, heavy bleeding    Family History  Problem Relation Age of Onset  . Diabetes Mother 67    type 2  . Heart disease Mother     pacemaker  . Obesity Mother   . Hypertension Mother   . Cancer Father 58    stomach  . Alcohol abuse Brother   . Diabetes Maternal Grandmother     type 2  . Heart disease Maternal Grandmother     pacer  . Diabetes Brother 63  . Heart attack Neg Hx   . Hyperlipidemia Neg Hx   . Sudden death Neg Hx     History   Social History  . Marital Status: Married    Spouse Name: N/A    Number of Children: N/A  . Years of Education: N/A   Occupational History  . Not on file.   Social History Main Topics  . Smoking status: Never Smoker   . Smokeless tobacco: Never Used  . Alcohol Use: No  . Drug Use: No  . Sexually Active: Yes -- Female partner(s)   Other Topics Concern  . Not on file   Social History Narrative  . No narrative on file    Current Outpatient Prescriptions on File Prior to Visit  Medication Sig Dispense Refill  .  cholecalciferol (VITAMIN D) 1000 UNITS tablet Take 1,000 Units by mouth daily.      Boris Lown Oil CAPS MegaRed krill oil caps daily by Schiff      . latanoprost (XALATAN) 0.005 % ophthalmic solution       . Multiple Vitamins-Minerals (MULTIVITAMIN PO) Take 1 tablet by mouth daily.      . Probiotic Product (MISC INTESTINAL FLORA REGULAT) CHEW Digestive Health probiotics daily by Schiff    0  . timolol (TIMOPTIC) 0.5 % ophthalmic solution       . Diclofenac Sodium 3 % GEL Transdermal therapeutics advanced osteoarthritis formula       No current facility-administered medications on file prior to visit.    No Known Allergies  Review of Systems  Review of Systems  Constitutional: Negative for fever and malaise/fatigue.  HENT: Negative for congestion.   Eyes: Negative for discharge.  Respiratory: Negative for shortness of breath.   Cardiovascular: Negative for chest pain, palpitations and leg swelling.  Gastrointestinal: Negative for nausea, abdominal pain and diarrhea.  Genitourinary: Negative for dysuria.  Musculoskeletal: Negative for falls.  Skin: Negative for rash.  Neurological: Negative for loss of consciousness and headaches.  Endo/Heme/Allergies: Negative for polydipsia.  Psychiatric/Behavioral: Negative for depression and suicidal ideas. The patient is not nervous/anxious and does not have insomnia.     Objective  BP 118/70  Pulse 79  Temp(Src) 98 F (36.7 C) (Oral)  Ht 5\' 6"  (1.676 m)  Wt 258 lb 8 oz (117.255 kg)  BMI 41.74 kg/m2  SpO2 98%  Physical Exam  Physical Exam  Constitutional: She is oriented to person, place, and time and well-developed, well-nourished, and in no distress. No distress.  HENT:  Head: Normocephalic and atraumatic.  Eyes: Conjunctivae are normal.  Neck: Neck supple. No thyromegaly present.  Cardiovascular: Normal rate, regular rhythm and normal heart sounds.   No murmur heard. Pulmonary/Chest: Effort normal and breath sounds normal. She  has no wheezes.  Abdominal: Soft. Bowel sounds are normal. She exhibits no distension and no mass. There is no tenderness. There is no rebound and no guarding.  Musculoskeletal: She exhibits no edema.  Lymphadenopathy:    She has no cervical adenopathy.  Neurological: She is alert and oriented to person, place, and time.  Skin: Skin is warm and dry. No rash noted. She is not diaphoretic.  Psychiatric: Memory, affect and judgment normal.    Lab Results  Component Value Date   TSH 2.17 07/08/2011   Lab Results  Component Value Date   WBC 7.8 01/20/2012   HGB 13.0 01/20/2012   HCT 39.7 01/20/2012   MCV 90.4 01/20/2012   PLT 255.0 01/20/2012   Lab Results  Component Value Date   CREATININE 1.0 01/20/2012   BUN 17 01/20/2012   NA 136 01/20/2012   K 4.0 01/20/2012   CL 104 01/20/2012   CO2 28 01/20/2012   Lab Results  Component Value Date   ALT 31 07/08/2011   AST 22 07/08/2011   ALKPHOS 75 07/08/2011   BILITOT 0.5 07/08/2011   Lab Results  Component Value Date   CHOL 175 07/08/2011   Lab Results  Component Value Date   HDL 59.00 07/08/2011   Lab Results  Component Value Date   LDLCALC 100* 07/08/2011   Lab Results  Component Value Date   TRIG 78.0 07/08/2011   Lab Results  Component Value Date   CHOLHDL 3 07/08/2011     Assessment & Plan  Preventative health care Encouraged DASH diet, increase exercise. Continues in a study at [facility], encouraged 8 hours of sleep nightly.  Obesity Encouraged DASH diet, avoid trans fats, minimize simple carbs and saturated fats, increase exercise as tolerated.   Left knee pain Maintain activity as tolerated. Continue krill oil caps

## 2012-07-31 NOTE — Assessment & Plan Note (Signed)
Encouraged DASH diet, avoid trans fats, minimize simple carbs and saturated fats, increase exercise as tolerated.

## 2013-01-10 ENCOUNTER — Other Ambulatory Visit: Payer: Self-pay

## 2013-01-16 ENCOUNTER — Telehealth: Payer: Self-pay

## 2013-01-16 NOTE — Telephone Encounter (Signed)
Patient called stating that she had a upcoming visit with a provider at [facility]. Patient wanted to know if labs could be scheduled early. Patient has a record of labs being order on 07/2012 by Dr. Abner Greenspan. Patient was informed of this, but was is instructed to call the [facility] office to double check to see if the lab orders would be accepted and if she needed to schedule a lab visit.

## 2013-01-21 ENCOUNTER — Other Ambulatory Visit: Payer: Self-pay | Admitting: Family Medicine

## 2013-01-21 DIAGNOSIS — E119 Type 2 diabetes mellitus without complications: Secondary | ICD-10-CM

## 2013-01-21 DIAGNOSIS — R7302 Impaired glucose tolerance (oral): Secondary | ICD-10-CM | POA: Insufficient documentation

## 2013-01-21 HISTORY — DX: Type 2 diabetes mellitus without complications: E11.9

## 2013-01-21 LAB — LIPID PANEL
Cholesterol: 190 mg/dL (ref 0–200)
Triglycerides: 183 mg/dL — ABNORMAL HIGH (ref <150)
VLDL: 37 mg/dL (ref 0–40)

## 2013-01-21 LAB — HEPATIC FUNCTION PANEL
ALT: 15 U/L (ref 0–35)
Albumin: 3.8 g/dL (ref 3.5–5.2)
Total Protein: 6.8 g/dL (ref 6.0–8.3)

## 2013-01-21 LAB — RENAL FUNCTION PANEL
Albumin: 3.8 g/dL (ref 3.5–5.2)
BUN: 18 mg/dL (ref 6–23)
Calcium: 9.2 mg/dL (ref 8.4–10.5)
Creat: 1.01 mg/dL (ref 0.50–1.10)
Glucose, Bld: 99 mg/dL (ref 70–99)
Phosphorus: 3 mg/dL (ref 2.3–4.6)

## 2013-01-21 LAB — CBC
HCT: 38.1 % (ref 36.0–46.0)
Hemoglobin: 12.8 g/dL (ref 12.0–15.0)
MCH: 30.2 pg (ref 26.0–34.0)
MCHC: 33.6 g/dL (ref 30.0–36.0)
RDW: 15.2 % (ref 11.5–15.5)

## 2013-01-21 LAB — TSH: TSH: 1.718 u[IU]/mL (ref 0.350–4.500)

## 2013-01-23 ENCOUNTER — Ambulatory Visit (INDEPENDENT_AMBULATORY_CARE_PROVIDER_SITE_OTHER): Payer: BC Managed Care – PPO | Admitting: Family Medicine

## 2013-01-23 ENCOUNTER — Other Ambulatory Visit: Payer: Self-pay | Admitting: Family Medicine

## 2013-01-23 ENCOUNTER — Encounter: Payer: Self-pay | Admitting: Family Medicine

## 2013-01-23 VITALS — BP 115/71 | HR 79 | Temp 97.4°F | Resp 18 | Ht 66.0 in | Wt 264.0 lb

## 2013-01-23 DIAGNOSIS — Z23 Encounter for immunization: Secondary | ICD-10-CM

## 2013-01-23 DIAGNOSIS — R7302 Impaired glucose tolerance (oral): Secondary | ICD-10-CM

## 2013-01-23 DIAGNOSIS — E66813 Obesity, class 3: Secondary | ICD-10-CM

## 2013-01-23 DIAGNOSIS — R7309 Other abnormal glucose: Secondary | ICD-10-CM

## 2013-01-23 DIAGNOSIS — Z0000 Encounter for general adult medical examination without abnormal findings: Secondary | ICD-10-CM

## 2013-01-23 LAB — HEMOGLOBIN A1C: Hgb A1c MFr Bld: 6.4 % — ABNORMAL HIGH (ref <5.7)

## 2013-01-23 NOTE — Assessment & Plan Note (Signed)
HbA1c 6.3% 07/2011. Repeat from last week is pending.

## 2013-01-23 NOTE — Progress Notes (Signed)
Office Note 01/23/2013  CC:  Chief Complaint  Patient presents with  . Annual Exam    HPI:  Sheri Liu is a 57 y.o. Black female who is here to establish care with me and review recent health panel labs + HbA1c (switching care from Dr. Abner Greenspan at [facility] location).  No acute complaints. Struggles with her weight.  Works long hours, exercises a few days a week but this focuses more on knee rehab/strengthening than cardiovascular exercise.  She does not really work that hard on limiting calories, esp lately with busy schedule.    Past Medical History  Diagnosis Date  . Obesity, Class III, BMI 40-49.9 (morbid obesity)   . Tinnitus 06/14/2011    transient  . Glaucoma 11-13  . Impaired glucose tolerance 01/21/2013    HbA1c 6.3% on 07/2011    Past Surgical History  Procedure Laterality Date  . Knee surgery  1989    left, arthroscopy to clean up meniscus  . Abdominal hysterectomy  1996    partial- still has ovaries, for fibroids, heavy bleeding  . Colonoscopy  approx 2009    in [place]: pt reports that it was NORMAL    Family History  Problem Relation Age of Onset  . Diabetes Mother 10    type 2  . Heart disease Mother     pacemaker  . Obesity Mother   . Hypertension Mother   . Cancer Father 57    stomach  . Alcohol abuse Brother   . Diabetes Maternal Grandmother     type 2  . Heart disease Maternal Grandmother     pacer  . Diabetes Brother 11  . Heart attack Neg Hx   . Hyperlipidemia Neg Hx   . Sudden death Neg Hx     History   Social History  . Marital Status: Married    Spouse Name: N/A    Number of Children: N/A  . Years of Education: N/A   Occupational History  . Not on file.   Social History Main Topics  . Smoking status: Never Smoker   . Smokeless tobacco: Never Used  . Alcohol Use: No  . Drug Use: No  . Sexual Activity: Yes    Partners: Male   Other Topics Concern  . Not on file   Social History Narrative   Married, has  2 children (one adopted and one step).   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   No tobacco.  No alcohol/drugs.   Tries to "be good" and eat a heart healthy diet.   She goes to a study 3 days per week at [facility] for knee therapy.    Outpatient Prescriptions Prior to Visit  Medication Sig Dispense Refill  . Krill Oil CAPS MegaRed krill oil caps daily by Schiff      . latanoprost (XALATAN) 0.005 % ophthalmic solution       . Multiple Vitamins-Minerals (MULTIVITAMIN PO) Take 1 tablet by mouth daily.      . Probiotic Product (MISC INTESTINAL FLORA REGULAT) CHEW Digestive Health probiotics daily by Schiff    0  . timolol (TIMOPTIC) 0.5 % ophthalmic solution       . cholecalciferol (VITAMIN D) 1000 UNITS tablet Take 1,000 Units by mouth daily.      . Diclofenac Sodium 3 % GEL Transdermal therapeutics advanced osteoarthritis formula       No facility-administered medications prior to visit.    No Known Allergies  ROS Review of Systems  Constitutional: Negative for fever and fatigue.  HENT: Negative for congestion and sore throat.   Eyes: Negative for visual disturbance.  Respiratory: Negative for cough.   Cardiovascular: Negative for chest pain.  Gastrointestinal: Negative for nausea and abdominal pain.  Genitourinary: Negative for dysuria.  Musculoskeletal: Negative for back pain and joint swelling.  Skin: Negative for rash.  Neurological: Negative for weakness and headaches.  Hematological: Negative for adenopathy.     PE; Blood pressure 115/71, pulse 79, temperature 97.4 F (36.3 C), temperature source Temporal, resp. rate 18, height 5\' 6"  (1.676 m), weight 264 lb (119.75 kg), SpO2 99.00%. Gen: Alert, well appearing, obese-appearing AA female.  Patient is oriented to person, place, time, and situation. Eyes: no injection, icteris, swelling, or exudate.  EOMI, PERRLA. Nose: no drainage or turbinate edema/swelling.  No injection or focal lesion.  Mouth: lips  without lesion/swelling.  Oral mucosa pink and moist.  Dentition intact and without obvious caries or gingival swelling.  Oropharynx without erythema, exudate, or swelling.  Neck - No masses or thyromegaly or limitation in range of motion CV: RRR, no m/r/g.   LUNGS: CTA bilat, nonlabored resps, good aeration in all lung fields. EXT: 1+ pitting edema in both pretibial regions  Pertinent labs:  Lab Results  Component Value Date   WBC 7.2 01/21/2013   HGB 12.8 01/21/2013   HCT 38.1 01/21/2013   MCV 89.9 01/21/2013   PLT 276 01/21/2013     Chemistry      Component Value Date/Time   NA 141 01/21/2013 1045   K 4.1 01/21/2013 1045   CL 107 01/21/2013 1045   CO2 28 01/21/2013 1045   BUN 18 01/21/2013 1045   CREATININE 1.01 01/21/2013 1045   CREATININE 1.0 01/20/2012 1447      Component Value Date/Time   CALCIUM 9.2 01/21/2013 1045   ALKPHOS 63 01/21/2013 1045   AST 13 01/21/2013 1045   ALT 15 01/21/2013 1045   BILITOT 0.3 01/21/2013 1045     Lab Results  Component Value Date   TSH 1.718 01/21/2013   Lab Results  Component Value Date   CHOL 190 01/21/2013   HDL 56 01/21/2013   LDLCALC 97 01/21/2013   TRIG 183* 01/21/2013   CHOLHDL 3.4 01/21/2013    ASSESSMENT AND PLAN:   Obesity, Class III, BMI 40-49.9 (morbid obesity) Discussed getting more motivated about increasing exercise and eating more prudent diet, esp if HbA1c comes back >6.5%. Discussed possible referral in future to bariatric clinic for further expertise/focus on this issue. Reviewed cholesterol and fasting glucose today: normal (of note, these were not truly fasting numbers: she had breakfast right before this lab draw).   Impaired glucose tolerance HbA1c 6.3% 07/2011. Repeat from last week is pending.  Preventative health care Flu vaccine today. She has received a recall letter for her mammogram recently, so she'll be setting this up soon. She is not a candidate for cervical cancer screening. She  reports having her first screening colonoscopy 4-5 years ago in [place] and it was normal.  Next one should be in 5-6 years.  An After Visit Summary was printed and given to the patient.  FOLLOW UP:  Return in about 6 months (around 07/23/2013) for f/u weight.

## 2013-01-23 NOTE — Assessment & Plan Note (Signed)
Discussed getting more motivated about increasing exercise and eating more prudent diet, esp if HbA1c comes back >6.5%. Discussed possible referral in future to bariatric clinic for further expertise/focus on this issue. Reviewed cholesterol and fasting glucose today: normal (of note, these were not truly fasting numbers: she had breakfast right before this lab draw).

## 2013-01-23 NOTE — Assessment & Plan Note (Signed)
Flu vaccine today. She has received a recall letter for her mammogram recently, so she'll be setting this up soon. She is not a candidate for cervical cancer screening. She reports having her first screening colonoscopy 4-5 years ago in [place] and it was normal.  Next one should be in 5-6 years.

## 2013-01-25 ENCOUNTER — Telehealth: Payer: Self-pay | Admitting: Family Medicine

## 2013-01-25 NOTE — Telephone Encounter (Signed)
Please advise 

## 2013-01-25 NOTE — Telephone Encounter (Signed)
Reassure pt that her HbA1c was 6.4%--minimal change from last check back in 2013 (6.3% at that time).-thx

## 2013-01-25 NOTE — Telephone Encounter (Signed)
Left detailed message on machine okay per DPR.

## 2013-01-25 NOTE — Telephone Encounter (Signed)
Patient is calling for results

## 2013-02-08 ENCOUNTER — Telehealth: Payer: Self-pay | Admitting: Family Medicine

## 2013-02-08 NOTE — Telephone Encounter (Signed)
Patient wanted to let Dr. Milinda Cave know that she is going to [facility] in [place] [phone] to do their weight loss program. No CB requested.

## 2013-02-11 ENCOUNTER — Encounter: Payer: Self-pay | Admitting: Family Medicine

## 2013-02-11 NOTE — Telephone Encounter (Signed)
Noted  

## 2013-07-04 ENCOUNTER — Ambulatory Visit (INDEPENDENT_AMBULATORY_CARE_PROVIDER_SITE_OTHER): Payer: BC Managed Care – PPO | Admitting: Nurse Practitioner

## 2013-07-04 ENCOUNTER — Encounter: Payer: Self-pay | Admitting: Nurse Practitioner

## 2013-07-04 VITALS — BP 134/76 | HR 89 | Temp 98.8°F | Ht 66.0 in | Wt 277.0 lb

## 2013-07-04 DIAGNOSIS — M7989 Other specified soft tissue disorders: Secondary | ICD-10-CM

## 2013-07-04 DIAGNOSIS — R1906 Epigastric swelling, mass or lump: Secondary | ICD-10-CM

## 2013-07-04 DIAGNOSIS — R0602 Shortness of breath: Secondary | ICD-10-CM

## 2013-07-04 DIAGNOSIS — M546 Pain in thoracic spine: Secondary | ICD-10-CM

## 2013-07-04 NOTE — Patient Instructions (Addendum)
I think back pain is related to muscle strain. Use heat on mid-back for several days and tylenol 1000 mg three times daily if needed. No weight lifting for 5 days. Do gentle stretches with no resistance daily . Be mindful of posture. Do exercises below twice daily. Hold positions for 10 seconds, repeat 10 times.  I am concerned about shortness of breath, sudden weight gain, and swelling in legs. Please get chest xray. My office will call you with results and follow up. Restrict salt to 2400 mg daily. Read about DASH diet and consider incorporating daily.  Nice to meet you.  Mid-Back Strain with Rehab  A strain is an injury in which a tendon or muscle is torn. The muscles and tendons of the mid-back are vulnverable to strains. However, these muscles and tendons are very strong and require a great force to be injured. The muscles of the mid-back are responsible for stabilizing the spinal column, as well as spinal twisting (rotation). Strains are classified into three categories. Grade 1 strains cause pain, but the tendon is not lengthened. Grade 2 strains include a lengthened ligament, due to the ligament being stretched or partially ruptured. With grade 2 strains there is still function, although the function may be decreased. Grade 3 strains involve a complete tear of the tendon or muscle, and function is usually impaired. SYMPTOMS   Pain in the middle of the back.  Pain that may affect only one side, and is worse with movement.  Muscle spasms, and often swelling in the back.  Loss of strength of the back muscles.  Crackling sound (crepitation) when the muscles are touched. CAUSES  Mid-back strains occur when a force is placed on the muscles or tendons that is greater than they can handle. Common causes of injury include:  Ongoing overuse of the muscle-tendon units in the middle back, usually from incorrect body posture.  A single violent injury or force applied to the back. RISK INCREASES  WITH:  Sports that involve twisting forces on the spine or a lot of bending at the waist (football, rugby, weightlifting, bowling, golf, tennis, speed skating, racquetball, swimming, running, gymnastics, diving).  Poor strength and flexibility.  Failure to warm up properly before activity.  Family history of low back pain or disk disorders.  Previous back injury or surgery (especially fusion). PREVENTION  Learn and use proper sports technique.  Warm up and stretch properly before activity.  Allow for adequate recovery between workouts.  Maintain physical fitness:  Strength, flexibility, and endurance.  Cardiovascular fitness. PROGNOSIS  If treated properly, mid-back strains usually heal within 6 weeks. RELATED COMPLICATIONS   Frequently recurring symptoms, resulting in a chronic problem. Properly treating the problem the first time decreases frequency of recurrence.  Chronic inflammation, scarring, and partial muscle-tendon tear.  Delayed healing or resolution of symptoms, especially if activity is resumed too soon.  Prolonged disability. TREATMENT Treatment first involves the use of ice and medicine, to reduce pain and inflammation. As the pain begins to subside, you may begin strengthening and stretching exercises to improve body posture and sport technique. These exercises may be performed at home or with a therapist. Severe injuries may require referral to a therapist for further evaluation and treatment, such as ultrasound. Corticosteroid injections may be given to help reduce inflammation. Biofeedback (watching monitors of your body processes) and psychotherapy may also be prescribed. Prolonged bed rest is felt to do more harm than good. Massage may help break the muscle spasms. Sometimes, an injection  of cortisone, with or without local anesthetics, may be given to help relieve the pain and spasms. MEDICATION   If pain medicine is needed, nonsteroidal anti-inflammatory  medicines (aspirin and ibuprofen), or other minor pain relievers (acetaminophen), are often advised.  Do not take pain medicine for 7 days before surgery.  Prescription pain relievers may be given, if your caregiver thinks they are needed. Use only as directed and only as much as you need.  Ointments applied to the skin may be helpful.  Corticosteroid injections may be given by your caregiver. These injections should be reserved for the most serious cases, because they may only be given a certain number of times. HEAT AND COLD:   Cold treatment (icing) should be applied for 10 to 15 minutes every 2 to 3 hours for inflammation and pain, and immediately after activity that aggravates your symptoms. Use ice packs or an ice massage.  Heat treatment may be used before performing stretching and strengthening activities prescribed by your caregiver, physical therapist, or athletic trainer. Use a heat pack or a warm water soak. SEEK IMMEDIATE MEDICAL CARE IF:  Symptoms get worse or do not improve in 2 to 4 weeks, despite treatment.  You develop numbness, weakness, or loss of bowel or bladder function.  New, unexplained symptoms develop. (Drugs used in treatment may produce side effects.) EXERCISES RANGE OF MOTION (ROM) AND STRETCHING EXERCISES - Mid-Back Strain These exercises may help you when beginning to rehabilitate your injury. In order to successfully resolve your symptoms, you must improve your posture. These exercises are designed to help reduce the forward-head and rounded-shoulder posture which contributes to this condition. Your symptoms may resolve with or without further involvement from your physician, physical therapist or athletic trainer. While completing these exercises, remember:   Restoring tissue flexibility helps normal motion to return to the joints. This allows healthier, less painful movement and activity.  An effective stretch should be held for at least 30 seconds.  A  stretch should never be painful. You should only feel a gentle lengthening or release in the stretched tissue. STRETECH - Axial Extension  Stand or sit on a firm surface. Assume a good posture: chest up, shoulders drawn back, stomach muscles slightly tense, knees unlocked (if standing) and feet hip width apart.  Slowly retract your chin, so your head slides back and your chin slightly lowers. Continue to look straight ahead.  You should feel a gentle stretch in the back of your head. Be certain not to feel an aggressive stretch since this can cause headaches later.  Hold for __________ seconds. Repeat __________ times. Complete this exercise __________ times per day. RANGE OF MOTION- Upper Thoracic Extension  Sit on a firm chair with a high back. Assume a good posture: chest up, shoulders drawn back, abdominal muscles slightly tense, and feet hip width apart. Place a small pillow or folded towel in the curve of your lower back, if you are having difficulty maintaining good posture.  Gently brace your neck with your hands, allowing your arms to rest on your chest.  Continue to support your neck and slowly extend your back over the chair. You will feel a stretch across your upper back.  Hold __________ seconds. Slowly return to the starting position. Repeat __________ times. Complete this exercise __________ times per day. RANGE OF MOTION- Mid-Thoracic Extension  Roll a towel so that it is about 4 inches in diameter.  Position the towel lengthwise. Lay on the towel so that your spine,  but not your shoulder blades, are supported.  You should feel your mid-back arching toward the floor. To increase the stretch, extend your arms away from your body.  Hold for __________ seconds. Repeat exercise __________ times, __________ times per day. STRENGTHENING EXERCISES - Mid-Back Strain These exercises may help you when beginning to rehabilitate your injury. They may resolve your symptoms with or  without further involvement from your physician, physical therapist or athletic trainer. While completing these exercises, remember:   Muscles can gain both the endurance and the strength needed for everyday activities through controlled exercises.  Complete these exercises as instructed by your physician, physical therapist or athletic trainer. Increase the resistance and repetitions only as guided by your caregiver.  You may experience muscle soreness or fatigue, but the pain or discomfort you are trying to eliminate should never worsen during these exercises. If this pain does worsen, stop and make certain you are following the directions exactly. If the pain is still present after adjustments, discontinue the exercise until you can discuss the trouble with your caregiver. STRENGTHENING Quadruped, Opposite UE/LE Lift  Assume a hands and knees position on a firm surface. Keep your hands under your shoulders and your knees under your hips. You may place padding under your knees for comfort.  Find your neutral spine and gently tense your abdominal muscles so that you can maintain this position. Your shoulders and hips should form a rectangle that is parallel with the floor and is not twisted.  Keeping your trunk steady, lift your right hand no higher than your shoulder and then your left leg no higher than your hip. Make sure you are not holding your breath. Hold this position __________ seconds.  Continuing to keep your abdominal muscles tense and your back steady, slowly return to your starting position. Repeat with the opposite arm and leg. Repeat __________ times. Complete this exercise __________ times per day.  STRENGTH - Shoulder Extensors  Secure a rubber exercise band or tubing to a fixed object (table, pole) so that it is at the height of your shoulders when you are either standing, or sitting on a firm armless chair.  With a thumbs-up grip, grasp an end of the band in each hand.  Straighten your elbows and lift your hands straight in front of you at shoulder height. Step back away from the secured end of band, until it becomes tense.  Squeezing your shoulder blades together, pull your hands down to the sides of your thighs. Do not allow your hands to go behind you.  Hold for __________ seconds. Slowly ease the tension on the band, as you reverse the directions and return to the starting position. Repeat __________ times. Complete this exercise __________ times per day.  STRENGTH - Horizontal Abductors Choose one of the two positions to complete this exercise. Prone: lying on stomach:  Lie on your stomach on a firm surface so that your right / left arm overhangs the edge. Rest your forehead on your opposite forearm. With your palm facing the floor and your elbow straight, hold a __________ weight in your hand.  Squeeze your right / left shoulder blade to your mid-back spine and then slowly raise your arm to the height of the bed.  Hold for __________ seconds. Slowly reverse the directions and return to the starting position, controlling the weight as you lower your arm. Repeat __________ times. Complete this exercise __________ times per day. Standing:   Secure a rubber exercise band or tubing, so  that it is at the height of your shoulders when you are either standing, or sitting on a firm armless chair.  Grasp an end of the band in each hand and have your palms face each other. Straighten your elbows and lift your hands straight in front of you at shoulder height. Step back away from the secured end of band, until it becomes tense.  Squeeze your shoulder blades together. Keeping your elbows locked and your hands at shoulder height, spread your arms apart, forming a "T" shape with your body. Hold __________ seconds. Slowly ease the tension on the band, as you reverse the directions and return to the starting position. Repeat __________ times. Complete this exercise  __________ times per day. STRENGTH - Scapular Retractors and External Rotators, Rowing  Secure a rubber exercise band or tubing, so that it is at the height of your shoulders when you are either standing, or sitting on a firm armless chair.  With a palm-down grip, grasp an end of the band in each hand. Straighten your elbows and lift your hands straight in front of you at shoulder height. Step back away from the secured end of band, until it becomes tense.  Step 1: Squeeze your shoulder blades together. Bending your elbows, draw your hands to your chest as if you are rowing a boat. At the end of this motion, your hands and elbow should be at shoulder height and your elbows should be out to your sides.  Step 2: Rotate your shoulder to raise your hands above your head. Your forearms should be vertical and your upper arms should be horizontal.  Hold for __________ seconds. Slowly ease the tension on the band, as you reverse the directions and return to the starting position. Repeat __________ times. Complete this exercise __________ times per day.  POSTURE AND BODY MECHANICS CONSIDERATIONS  Mid-Back Strain Keeping correct posture when sitting, standing or completing your activities will reduce the stress put on different body tissues, allowing injured tissues a chance to heal and limiting painful experiences. The following are general guidelines for improved posture. Your physician or physical therapist will provide you with any instructions specific to your needs. While reading these guidelines, remember:  The exercises prescribed by your provider will help you have the flexibility and strength to maintain correct postures.  The correct posture provides the best environment for your joints to work. All of your joints have less wear and tear when properly supported by a spine with good posture. This means you will experience a healthier, less painful body.  Correct posture must be practiced with all  of your activities, especially prolonged sitting and standing. Correct posture is as important when doing repetitive low-stress activities (typing) as it is when doing a single heavy-load activity (lifting). PROPER SITTING POSTURE In order to minimize stress and discomfort on your spine, you must sit with correct posture. Sitting with good posture should be effortless for a healthy body. Returning to good posture is a gradual process. Many people can work toward this most comfortably by using various supports until they have the flexibility and strength to maintain this posture on their own. When sitting with proper posture, your ears will fall over your shoulders and your shoulders will fall over your hips. You should use the back of the chair to support your upper back. Your lower back will be in a neutral position, just slightly arched. You may place a small pillow or folded towel at the base of your low  back for  support.  When working at a desk, create an environment that supports good, upright posture. Without extra support, muscles fatigue and lead to excessive strain on joints and other tissues. Keep these recommendations in mind: CHAIR:  A chair should be able to slide under your desk when your back makes contact with the back of the chair. This allows you to work closely.  The chair's height should allow your eyes to be level with the upper part of your monitor and your hands to be slightly lower than your elbows. BODY POSITION  Your feet should make contact with the floor. If this is not possible, use a foot rest.  Keep your ears over your shoulders. This will reduce stress on your neck and lower back. INCORRECT SITTING POSTURES If you are feeling tired and unable to assume a healthy sitting posture, do not slouch or slump. This puts excessive strain on your back tissues, causing more damage and pain. Healthier options include:  Using more support, like a lumbar pillow.  Switching  tasks to something that requires you to be upright or walking.  Talking a brief walk.  Lying down to rest in a neutral-spine position. CORRECT STANDING POSTURES Proper standing posture should be assumed with all daily activities, even if they only take a few moments, like when brushing your teeth. As in sitting, your ears should fall over your shoulders and your shoulders should fall over your hips. You should keep a slight tension in your abdominal muscles to brace your spine. Your tailbone should point down to the ground, not behind your body, resulting in an over-extended swayback posture.  INCORRECT STANDING POSTURES Common incorrect standing postures include a forward head, locked knees, and an excessive swayback. WALKING Walk with an upright posture. Your ears, shoulders and hips should all line-up. CORRECT LIFTING TECHNIQUES DO :   Assume a wide stance. This will provide you more stability and the opportunity to get as close as possible to the object which you are lifting.  Tense your abdominals to brace your spine. Bend at the knees and hips. Keeping your back locked in a neutral-spine position, lift using your leg muscles. Lift with your legs, keeping your back straight.  Test the weight of unknown objects before attempting to lift them.  Try to keep your elbows locked down at your sides in order get the best strength from your shoulders when carrying an object.  Always ask for help when lifting heavy or awkward objects. INCORRECT LIFTING TECHNIQUES DO NOT:   Lock your knees when lifting, even if it is a small object.  Bend and twist. Pivot at your feet or move your feet when needing to change directions.  Assume that you can safely pick up even a paperclip without proper posture. Document Released: 02/21/2005 Document Revised: 05/16/2011 Document Reviewed: 06/05/2008 [facility] Patient Information 2014 [place], [place].

## 2013-07-04 NOTE — Progress Notes (Signed)
Pre visit review using our clinic review tool, if applicable. No additional management support is needed unless otherwise documented below in the visit note. 

## 2013-07-05 ENCOUNTER — Telehealth: Payer: Self-pay | Admitting: Nurse Practitioner

## 2013-07-05 ENCOUNTER — Ambulatory Visit (INDEPENDENT_AMBULATORY_CARE_PROVIDER_SITE_OTHER): Payer: BC Managed Care – PPO

## 2013-07-05 DIAGNOSIS — M546 Pain in thoracic spine: Secondary | ICD-10-CM | POA: Insufficient documentation

## 2013-07-05 DIAGNOSIS — R1906 Epigastric swelling, mass or lump: Secondary | ICD-10-CM

## 2013-07-05 DIAGNOSIS — R0602 Shortness of breath: Secondary | ICD-10-CM | POA: Insufficient documentation

## 2013-07-05 DIAGNOSIS — K7689 Other specified diseases of liver: Secondary | ICD-10-CM

## 2013-07-05 DIAGNOSIS — R635 Abnormal weight gain: Secondary | ICD-10-CM

## 2013-07-05 DIAGNOSIS — M7989 Other specified soft tissue disorders: Secondary | ICD-10-CM

## 2013-07-05 NOTE — Assessment & Plan Note (Addendum)
Tight feeliing around upper abdomen. Reports 10 lb weight gain in 2 week. Our records show 19 lb weight gain in 1 yr & 13 lb weight gain in 6 mos. DD: PUD, HF, gall bladder disease Abd US, CXR

## 2013-07-05 NOTE — Telephone Encounter (Signed)
pls call pt: Advise No findings on cxr to explain SOB or weight gain.

## 2013-07-05 NOTE — Telephone Encounter (Signed)
pls call pt: Advise Abdominal ultrasound shows significant amount of bowel gas-may cause her to feel bloated.  Bowel gas may be r/t constipation or lactose intolerance. She can take 1 capful miralax mixed with 8 oz water every day until she is having large bowel movement daily. Decrease or eliminate dairy.  She has fatty liver disease-not likely to cause problem, but will  monitor liver enzymes yearly. Treatment is weight loss.

## 2013-07-05 NOTE — Progress Notes (Signed)
Subjective:     Sheri Liu is a 58 y.o. female who presents c/o L-sided thoracic pain under scapula. Pain started few days ago. Aleve relives pain. Movement of L arm & shoulder & twisting of trunk ,makes worse. Pain is intermittent, mild. Also c/o SOB w/activity, tight sensation at base of ribs/epigastric area relieved by belching, and 10 lb weight gain in 2 weeks.  Denies cough, palpitations, LE swelling, nausea, constipation, & diarrhea.  The following portions of the patient's history were reviewed and updated as appropriate: allergies, current medications, past medical history, past social history, past surgical history and problem list.  Review of Systems Pertinent items are noted in HPI.    Objective:    BP 134/76  Pulse 89  Temp(Src) 98.8 F (37.1 C) (Temporal)  Ht 5\' 6"  (1.676 m)  Wt 277 lb (125.646 kg)  BMI 44.73 kg/m2  SpO2 94% BP 134/76  Pulse 89  Temp(Src) 98.8 F (37.1 C) (Temporal)  Ht 5\' 6"  (1.676 m)  Wt 277 lb (125.646 kg)  BMI 44.73 kg/m2  SpO2 94% General appearance: alert, cooperative, appears stated age and no distress Head: Normocephalic, without obvious abnormality, atraumatic Eyes: negative findings: lids and lashes normal and conjunctivae and sclerae normal Back: significant kyphosis, no spinal tenderness, cannot reporoduce pain where she says it hurts. Lungs: clear to auscultation bilaterally Heart: regular rate and rhythm, S1, S2 normal, no murmur, click, rub or gallop Abdomen: soft, non-tender; bowel sounds normal; no masses,  no organomegaly and feels tight epigastric center Extremities: edema +1 pitting bilateral Pulses: 2+ and symmetric    Assessment & plan:     1. SOB (shortness of breath)   2. Leg swelling   3. Epigastric fullness   4. Back pain, thoracic     See problem list for complete A&P  F/u determined by CXR & abd [place]

## 2013-07-05 NOTE — Telephone Encounter (Signed)
Patient notified of results. Pt expressed understanding.

## 2013-07-05 NOTE — Assessment & Plan Note (Signed)
L-sided pain just under scapula. Changes w/movement. Relieved by aleve. Likely MSK. No weight work outs, ROM only. Heat.

## 2013-07-05 NOTE — Assessment & Plan Note (Addendum)
DD: weight gain, pulmonary edema, anemia Onset 1 week accompanied by 10 lb weight gain in 2 weeks. Our records show 19 lb weight gain in 1 yr & 13 lb weight gain in 6 mos. CMET, TSH, CBC nml  6 mos ago. ECG-NSR CXR-pending

## 2013-07-19 ENCOUNTER — Encounter: Payer: Self-pay | Admitting: Nurse Practitioner

## 2013-07-19 ENCOUNTER — Ambulatory Visit (INDEPENDENT_AMBULATORY_CARE_PROVIDER_SITE_OTHER): Payer: BC Managed Care – PPO | Admitting: Nurse Practitioner

## 2013-07-19 VITALS — BP 127/81 | HR 74 | Temp 98.0°F | Ht 66.0 in | Wt 271.5 lb

## 2013-07-19 DIAGNOSIS — M546 Pain in thoracic spine: Secondary | ICD-10-CM

## 2013-07-19 DIAGNOSIS — R1906 Epigastric swelling, mass or lump: Secondary | ICD-10-CM

## 2013-07-19 DIAGNOSIS — Z6841 Body Mass Index (BMI) 40.0 and over, adult: Secondary | ICD-10-CM

## 2013-07-19 DIAGNOSIS — R0602 Shortness of breath: Secondary | ICD-10-CM

## 2013-07-19 MED ORDER — LORCASERIN HCL 10 MG PO TABS: 10.0000 mg | ORAL_TABLET | Freq: Two times a day (BID) | ORAL | Status: DC

## 2013-07-19 NOTE — Progress Notes (Signed)
Pre visit review using our clinic review tool, if applicable. No additional management support is needed unless otherwise documented below in the visit note. 

## 2013-07-19 NOTE — Patient Instructions (Addendum)
Fiber goal: minimum of 50 grams daily from whole food (not from bottle or sprinkled on prepared food) whole fruits, berries, melons, vegetables, brown rice, WHOLE grains. Start belviq as directed. I want to see you in 1 month to check labs & monitor for side effects. Re-read Eat to Live by Monico HoarJoel Fuhrman. Nice to see you!  Exercise to Lose Weight Exercise and a healthy diet may help you lose weight. Your doctor may suggest specific exercises. EXERCISE IDEAS AND TIPS  Choose low-cost things you enjoy doing, such as walking, bicycling, or exercising to workout videos.  Take stairs instead of the elevator.  Walk during your lunch break.  Park your car further away from work or school.  Go to a gym or an exercise class.  Start with 5 to 10 minutes of exercise each day. Build up to 30 minutes of exercise 4 to 6 days a week.  Wear shoes with good support and comfortable clothes.  Stretch before and after working out.  Work out until you breathe harder and your heart beats faster.  Drink extra water when you exercise.  Do not do so much that you hurt yourself, feel dizzy, or get very short of breath. Exercises that burn about 150 calories:  Running 1  miles in 15 minutes.  Playing volleyball for 45 to 60 minutes.  Washing and waxing a car for 45 to 60 minutes.  Playing touch football for 45 minutes.  Walking 1  miles in 35 minutes.  Pushing a stroller 1  miles in 30 minutes.  Playing basketball for 30 minutes.  Raking leaves for 30 minutes.  Bicycling 5 miles in 30 minutes.  Walking 2 miles in 30 minutes.  Dancing for 30 minutes.  Shoveling snow for 15 minutes.  Swimming laps for 20 minutes.  Walking up stairs for 15 minutes.  Bicycling 4 miles in 15 minutes.  Gardening for 30 to 45 minutes.  Jumping rope for 15 minutes.  Washing windows or floors for 45 to 60 minutes. Document Released: 03/26/2010 Document Revised: 05/16/2011 Document Reviewed:  03/26/2010 [facility] Patient Information 2014 [place], [place].

## 2013-07-24 DIAGNOSIS — Z6841 Body Mass Index (BMI) 40.0 and over, adult: Secondary | ICD-10-CM

## 2013-07-24 NOTE — Assessment & Plan Note (Signed)
Resolved with 6 pound weight loss & more frequent BM. CXR, ECG all nml.

## 2013-07-24 NOTE — Assessment & Plan Note (Addendum)
Lost 6 lbs. Since last visit by cutting out dairy & increasing exercise. Struggle is overeating. Start belviq 10 mg po q12h.Discussed potential side effects. Goal: 5% weight loss in 12 weeks. Read Eat to Live. Consume 50 grams fiber daily from whole foods. F/u 1 mo to monitor for SE.

## 2013-07-24 NOTE — Progress Notes (Signed)
Subjective:     Sheri Liu is a 58 y.o. female presents for follow up of epigastric fullness, pain at L scapula, & SOB. W/u included: US -showed fatty liver & significant bowel gas, CXR nml & ECG showed NSR. She altered upper body work out, cut out dairy, increased cardio exercise resulting in 6 pound weight loss & more frequent BM. She reports symptoms have resolved. She is concerned about BMI & is motivated to lose weight. She feels overeating is her main problem. She states she has read many books about nutrition & knows what to eat, bit does not execute what she knows.  The following portions of the patient's history were reviewed and updated as appropriate: allergies, current medications, past medical history, past social history, past surgical history and problem list.  Review of Systems Pertinent items are noted in HPI.    Objective:    BP 127/81  Pulse 74  Temp(Src) 98 F (36.7 C) (Temporal)  Ht 5\' 6"  (1.676 m)  Wt 271 lb 8 oz (123.152 kg)  BMI 43.84 kg/m2  SpO2 95% BP 127/81  Pulse 74  Temp(Src) 98 F (36.7 C) (Temporal)  Ht 5\' 6"  (1.676 m)  Wt 271 lb 8 oz (123.152 kg)  BMI 43.84 kg/m2  SpO2 95% General appearance: alert, cooperative, appears stated age and no distress Head: Normocephalic, without obvious abnormality, atraumatic Eyes: negative findings: lids and lashes normal and conjunctivae and sclerae normal    Assessment:     1. BMI 40.0-44.9, adult   2. SOB (shortness of breath)   3. Epigastric fullness   4. Back pain, thoracic     Plan:   See problem list for complete A&P Spent more than 20 minutes discussing diet modification & medical options for weight loss.

## 2013-07-24 NOTE — Assessment & Plan Note (Signed)
US showed significant bowel gas. Symptoms improved since cut out dairy & having more frequent BM. Avoid overeating.

## 2013-07-24 NOTE — Assessment & Plan Note (Signed)
Resolved. Stopped weight bearing work out in upper body. Seemed to be correlated w/epigastric fullness, which has resolved w/6 lb weight loss.

## 2013-08-14 ENCOUNTER — Telehealth: Payer: Self-pay | Admitting: Nurse Practitioner

## 2013-08-14 NOTE — Telephone Encounter (Addendum)
Patient stated that if insurance does not have dx information the for [place] will be $210. Called [facility] to have fax over PA request.

## 2013-08-14 NOTE — Telephone Encounter (Signed)
Patient is requesting call back in regards to [facility] script

## 2013-08-15 NOTE — Telephone Encounter (Signed)
Waiting for PA approval.

## 2013-08-20 ENCOUNTER — Ambulatory Visit: Payer: BC Managed Care – PPO | Admitting: Nurse Practitioner

## 2013-08-21 NOTE — Telephone Encounter (Signed)
Patient's PA was approved. Faxed copy to patient's pharmacy and copy sent to scan.

## 2013-12-06 ENCOUNTER — Ambulatory Visit (INDEPENDENT_AMBULATORY_CARE_PROVIDER_SITE_OTHER): Payer: BC Managed Care – PPO

## 2013-12-06 DIAGNOSIS — Z23 Encounter for immunization: Secondary | ICD-10-CM

## 2014-02-24 ENCOUNTER — Encounter: Payer: BC Managed Care – PPO | Admitting: Nurse Practitioner

## 2014-02-24 ENCOUNTER — Ambulatory Visit: Payer: BC Managed Care – PPO | Admitting: Nurse Practitioner

## 2014-05-02 ENCOUNTER — Encounter: Payer: Self-pay | Admitting: Nurse Practitioner

## 2014-05-02 ENCOUNTER — Ambulatory Visit (INDEPENDENT_AMBULATORY_CARE_PROVIDER_SITE_OTHER): Payer: PRIVATE HEALTH INSURANCE | Admitting: Nurse Practitioner

## 2014-05-02 VITALS — BP 122/70 | HR 79 | Temp 97.7°F | Ht 66.0 in | Wt 276.0 lb

## 2014-05-02 DIAGNOSIS — R5383 Other fatigue: Secondary | ICD-10-CM

## 2014-05-02 DIAGNOSIS — G4700 Insomnia, unspecified: Secondary | ICD-10-CM
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
## 2014-05-02 NOTE — Patient Instructions (Signed)
Sleep hygiene: no screen time 1 hour before bed. If you awake during night, get out of bed & go to another room in house-read, listen to music until you feel sleepy, then return to bed. Take TV out of bedroom. No caffeine after 6 pm.  Develop lifelong habits of exercise most days of the week: take a 30 minute walk. The benefits include weight loss, lower risk for heart disease, diabetes, stroke, high blood pressure, lower rates of depression & dementia, better sleep quality & bone health.  Cut out refined sugar: anything that is sweet when you eat or drink it except fresh fruit. Cut out refined grains: white bread, rolls, biscuits, bagels, muffins, pasta and cereals. Choose grains that have 4 gm or more of fiber per serving.   My office will call with lab results. I will see you in 2 weeks.

## 2014-05-02 NOTE — Progress Notes (Signed)
Pre visit review using our clinic review tool, if applicable. No additional management support is needed unless otherwise documented below in the visit note. 

## 2014-05-05 LAB — CBC WITH DIFFERENTIAL/PLATELET
HCT: 41.8 % (ref 36.0–46.0)
HEMOGLOBIN: 13 g/dL (ref 12.0–15.0)
MCH: 29.5 pg (ref 26.0–34.0)
MCHC: 31.1 g/dL (ref 30.0–36.0)
MCV: 94.8 fL (ref 78.0–100.0)
MPV: 12 fL (ref 8.6–12.4)
PLATELETS: 267 10*3/uL (ref 150–400)
RBC: 4.41 MIL/uL (ref 3.87–5.11)
RDW: 15.2 % (ref 11.5–15.5)
WBC: 7.4 10*3/uL (ref 4.0–10.5)

## 2014-05-05 LAB — COMPREHENSIVE METABOLIC PANEL
ALBUMIN: 4 g/dL (ref 3.5–5.2)
ALT: 16 U/L (ref 0–35)
AST: 13 U/L (ref 0–37)
Alkaline Phosphatase: 68 U/L (ref 39–117)
BILIRUBIN TOTAL: 0.3 mg/dL (ref 0.2–1.2)
BUN: 14 mg/dL (ref 6–23)
CO2: 24 mEq/L (ref 19–32)
CREATININE: 0.91 mg/dL (ref 0.50–1.10)
Calcium: 9.6 mg/dL (ref 8.4–10.5)
Chloride: 106 mEq/L (ref 96–112)
Glucose, Bld: 87 mg/dL (ref 70–99)
Potassium: 4.3 mEq/L (ref 3.5–5.3)
Sodium: 142 mEq/L (ref 135–145)
Total Protein: 7.1 g/dL (ref 6.0–8.3)

## 2014-05-05 LAB — T4, FREE: Free T4: 0.83 ng/dL (ref 0.80–1.80)

## 2014-05-05 LAB — HEMOGLOBIN A1C
Hgb A1c MFr Bld: 6.6 % — ABNORMAL HIGH (ref <5.7)
Mean Plasma Glucose: 143 mg/dL — ABNORMAL HIGH (ref <117)

## 2014-05-05 LAB — TSH: TSH: 2.243 u[IU]/mL (ref 0.350–4.500)

## 2014-05-06 ENCOUNTER — Telehealth: Payer: Self-pay | Admitting: Nurse Practitioner

## 2014-05-06 NOTE — Telephone Encounter (Signed)
pls call pt: Advise All labs are good except diabetes screen: She has developed diabetes.  It is reversible. Pls schedule OV to discuss plan.

## 2014-05-06 NOTE — Telephone Encounter (Signed)
LMOVM for pt to return call 

## 2014-05-07 NOTE — Telephone Encounter (Signed)
Patient returned call and was given results. Patient scheduled appt for 05/07/14 at 4:15 pm.

## 2014-05-09 ENCOUNTER — Ambulatory Visit: Payer: PRIVATE HEALTH INSURANCE | Admitting: Nurse Practitioner

## 2014-06-12 DIAGNOSIS — M19041 Primary osteoarthritis, right hand: Secondary | ICD-10-CM | POA: Insufficient documentation

## 2014-07-07 ENCOUNTER — Encounter: Payer: Self-pay | Admitting: Family Medicine

## 2014-07-21 ENCOUNTER — Telehealth: Payer: Self-pay | Admitting: Family Medicine

## 2014-07-21 NOTE — Telephone Encounter (Signed)
Pt would like to schedule mammogram.   Pt states she will call breast center when she is back from vacation.  I told her to remind them to fax results to her PCP.

## 2015-02-23 DIAGNOSIS — E781 Pure hyperglyceridemia: Secondary | ICD-10-CM | POA: Insufficient documentation

## 2015-02-23 DIAGNOSIS — R7303 Prediabetes: Secondary | ICD-10-CM | POA: Insufficient documentation

## 2015-09-17 DIAGNOSIS — G5602 Carpal tunnel syndrome, left upper limb: Secondary | ICD-10-CM | POA: Insufficient documentation

## 2015-10-23 DIAGNOSIS — H401190 Primary open-angle glaucoma, unspecified eye, stage unspecified: Secondary | ICD-10-CM | POA: Insufficient documentation

## 2016-08-05 HISTORY — PX: COLONOSCOPY: SHX174

## 2017-05-01 ENCOUNTER — Ambulatory Visit: Payer: PRIVATE HEALTH INSURANCE | Admitting: Family Medicine

## 2017-05-26 ENCOUNTER — Ambulatory Visit: Payer: Managed Care, Other (non HMO) | Admitting: Family Medicine

## 2017-05-26 ENCOUNTER — Encounter: Payer: Self-pay | Admitting: Family Medicine

## 2017-05-26 VITALS — BP 147/80 | HR 77 | Temp 97.5°F | Resp 16

## 2017-05-26 DIAGNOSIS — R062 Wheezing: Secondary | ICD-10-CM

## 2017-05-26 DIAGNOSIS — E119 Type 2 diabetes mellitus without complications: Secondary | ICD-10-CM | POA: Diagnosis not present

## 2017-05-26 DIAGNOSIS — J300 Vasomotor rhinitis: Secondary | ICD-10-CM

## 2017-05-26 LAB — POCT GLYCOSYLATED HEMOGLOBIN (HGB A1C): Hemoglobin A1C: 5.9

## 2017-05-26 MED ORDER — AZELASTINE HCL 0.1 % NA SOLN: NASAL | 11 refills | Status: DC

## 2017-05-26 NOTE — Progress Notes (Signed)
Office Note 05/26/2017  CC:  Chief Complaint  Patient presents with  . Wheezing  . Diabetes   HPI:  Sheri Liu is a 62 y.o. female who is here to re-establish care.  He was last seen here by Sheri SarinLayne Weaver, FNP 04/2014. I last saw him here in 2014 when he transferred to me from [place] Abner [facility].  Since 2016 pt has been seeing a provider [facility]Dr. Wilmer Liu who was within her insurer's network.  Now that she has changed insurer's, I am back in her network. Old records in EMR/HL reviewed prior to or during today's visit.  Her most recent visit with Dr. Wilmer Liu was 07/2016 for CPE. At that time she was referred to GI for colon ca screening, a mammogram was ordered, and all labs were wnl except her HbA1c was 6.4%.  LDL was 106, HDL 51, trigs 161, T chol 161170.  DM 2: no meds, good control.  No home glucose monitoring.  Denies polyuria or polydipsia.  Has gained 15 lbs in last few months.  She has fallen off on her diet and exercise. She has appt for 06/2017 with bariatric center of [place]---wants to get help -nonsurgical.  Hears a wheeze/whistle from neck region.  No cough or SOB or chest tightness. Feels like nose is congested. She does snore loudly.  No one has told her of any apneic spells in sleep. She is busy during her days, but if she slows down and is bored she gets significantly sleepy. No morning headaches. Home bp monitoring daily 120s/70s.  Past Medical History:  Diagnosis Date  . Diabetes mellitus without complication (HCC) 01/21/2013   hx well controlled  . Glaucoma 11-13  . Obesity, Class III, BMI 40-49.9 (morbid obesity) (HCC)    [place] [facility] [place] [facility] as of 02/2013  . Tinnitus 06/14/2011   transient    Past Surgical History:  Procedure Laterality Date  . ABDOMINAL HYSTERECTOMY  1996   partial- still has ovaries, for fibroids, heavy bleeding  . [place]  approx 2009   [place]: pt reports  that it was NORMAL  . COLONOSCOPY  08/2016   NORMAL per pt  . KNEE SURGERY  1989   left, arthroscopy to clean up meniscus    Family History  Problem Relation Age of Onset  . Diabetes Mother [id number]       type 2  . Heart disease Mother        pacemaker  . Obesity Mother   . Hypertension Mother   . Cancer Father [id number]       stomach  . Alcohol abuse Brother   . Diabetes Maternal Grandmother        type 2  . Heart disease Maternal Grandmother        pacer  . Diabetes Brother [id number]  . Heart attack Neg Hx   . Hyperlipidemia Neg Hx   . Sudden death Neg Hx     Social History   Socioeconomic History  . Marital status: Married    Spouse name: Not on file  . Number of children: Not on file  . Years of education: Not on file  . Highest education level: Not on file  Occupational History  . Not on file  Social Needs  . Financial resource strain: Not on file  . Food insecurity:    Worry: Not on file    Inability: Not on file  . Transportation needs:  Medical: Not on file    Non-medical: Not on file  Tobacco Use  . Smoking status: Never Smoker  . Smokeless tobacco: Never Used  Substance and Sexual Activity  . Alcohol use: No  . Drug use: No  . Sexual activity: Yes    Partners: Male  Lifestyle  . Physical activity:    Days per week: Not on file    Minutes per session: Not on file  . Stress: Not on file  Relationships  . Social connections:    Talks on phone: Not on file    Gets together: Not on file    Attends religious service: Not on file    Active member of club or organization: Not on file    Attends meetings of clubs or organizations: Not on file    Relationship status: Not on file  . Intimate partner violence:    Fear of current or ex partner: Not on file    Emotionally abused: Not on file    Physically abused: Not on file    Forced sexual activity: Not on file  Other Topics Concern  . Not on file  Social History Narrative   Married, has 2 children (one adopted  and one step).   Occupation: IT   No tobacco.  No alcohol/drugs.   Tries to "be good" and eat a heart healthy diet.    Outpatient Encounter Medications as of 05/26/2017  Medication Sig  . latanoprost (XALATAN) 0.005 % ophthalmic solution   . Multiple Vitamins-Minerals (MULTIVITAMIN PO) Take 1 tablet by mouth daily.  . Probiotic Product (MISC INTESTINAL FLORA REGULAT) CHEW Digestive Health probiotics daily by Schiff  . timolol (TIMOPTIC) 0.5 % ophthalmic solution   . azelastine (ASTELIN) 0.1 % nasal spray 2 sprays in each nostril every 12 hours as needed for nasal congestion   No facility-administered encounter medications on file as of 05/26/2017.     No Known Allergies  ROS Review of Systems  Constitutional: Negative for fatigue and fever.  HENT: Negative for congestion and sore throat.   Eyes: Negative for visual disturbance.  Respiratory: Negative for cough.   Cardiovascular: Negative for chest pain.  Gastrointestinal: Negative for abdominal pain and nausea.  Genitourinary: Negative for dysuria.  Musculoskeletal: Negative for back pain and joint swelling.  Skin: Negative for rash.  Neurological: Negative for weakness and headaches.  Hematological: Negative for adenopathy.    PE; Blood pressure (!) 147/80, pulse 77, temperature (!) 97.5 F (36.4 C), temperature source Oral, resp. rate 16, SpO2 97 %.  Gen: Alert, well appearing.  Patient is oriented to person, place, time, and situation. AFFECT: pleasant, lucid thought and speech. ZOX:WRUE: no injection, icteris, swelling, or exudate.  EOMI, PERRLA. Mouth: lips without lesion/swelling.  Oral mucosa pink and moist. Oropharynx without erythema, exudate, or swelling.  CV: RRR, no m/r/g.   Neck - No masses or thyromegaly or limitation in range of motion LUNGS: CTA bilat, nonlabored resps, good aeration in all lung fields.  Pertinent labs:  Lab Results  Component Value Date   TSH 2.243 05/02/2014   Lab Results  Component  Value Date   WBC 7.4 05/02/2014   HGB 13.0 05/02/2014   HCT 41.8 05/02/2014   MCV 94.8 05/02/2014   PLT 267 05/02/2014   Lab Results  Component Value Date   CREATININE 0.91 05/02/2014   BUN 14 05/02/2014   NA 142 05/02/2014   K 4.3 05/02/2014   CL 106 05/02/2014   CO2 24 05/02/2014  Lab Results  Component Value Date   ALT 16 05/02/2014   AST 13 05/02/2014   ALKPHOS 68 05/02/2014   BILITOT 0.3 05/02/2014   Lab Results  Component Value Date   CHOL 190 01/21/2013   Lab Results  Component Value Date   HDL 56 01/21/2013   Lab Results  Component Value Date   LDLCALC 97 01/21/2013   Lab Results  Component Value Date   TRIG 183 (H) 01/21/2013   Lab Results  Component Value Date   CHOLHDL 3.4 01/21/2013   Lab Results  Component Value Date   HGBA1C 5.9 05/26/2017   HbA1c 5.9% today.  ASSESSMENT AND PLAN:   Re-establishing care.  1) DM 2; diet controlled.  Great A1c today.  2) Morbid obesity: continue with plan of establishing with bariatric clinic in [place] next month.  3) Vasomotor rhinitis: start trial of astelin.  4) Pseudowheeze: I think this is coming from around her epiglottis/posterior pharynx region and is likely from mucous that is a combo of PND and LPR.  She is not bothered by any associated sx's--no treatment recommended at this time. No sign of asthma or other cause of wheezing.  An After Visit Summary was printed and given to the patient.   Return in about 3 months (around 08/26/2017) for annual CPE (fasting).  Signed:  Santiago Bumpers, MD           05/26/2017

## 2017-08-31 ENCOUNTER — Encounter: Payer: Managed Care, Other (non HMO) | Admitting: Family Medicine

## 2017-11-27 ENCOUNTER — Encounter: Payer: Self-pay | Admitting: Family Medicine

## 2017-11-27 ENCOUNTER — Ambulatory Visit: Payer: Managed Care, Other (non HMO) | Admitting: Family Medicine

## 2017-11-27 ENCOUNTER — Encounter: Payer: Self-pay | Admitting: *Deleted

## 2017-11-27 VITALS — BP 132/86 | HR 80 | Temp 97.7°F | Resp 16 | Ht 66.0 in | Wt 282.2 lb

## 2017-11-27 DIAGNOSIS — R5383 Other fatigue: Secondary | ICD-10-CM | POA: Diagnosis not present

## 2017-11-27 DIAGNOSIS — R400 Somnolence: Secondary | ICD-10-CM | POA: Diagnosis not present

## 2017-11-27 DIAGNOSIS — E162 Hypoglycemia, unspecified: Secondary | ICD-10-CM

## 2017-11-27 DIAGNOSIS — E119 Type 2 diabetes mellitus without complications: Secondary | ICD-10-CM | POA: Diagnosis not present

## 2017-11-27 DIAGNOSIS — R3915 Urgency of urination: Secondary | ICD-10-CM | POA: Diagnosis not present

## 2017-11-27 DIAGNOSIS — I950 Idiopathic hypotension: Secondary | ICD-10-CM

## 2017-11-27 DIAGNOSIS — G4733 Obstructive sleep apnea (adult) (pediatric): Secondary | ICD-10-CM

## 2017-11-27 LAB — POCT URINALYSIS DIPSTICK
BILIRUBIN UA: NEGATIVE
Blood, UA: 1
Glucose, UA: NEGATIVE
KETONES UA: NEGATIVE
Leukocytes, UA: NEGATIVE
NITRITE UA: NEGATIVE
PROTEIN UA: NEGATIVE
Urobilinogen, UA: 0.2 E.U./dL
pH, UA: 5.5 (ref 5.0–8.0)

## 2017-11-27 LAB — GLUCOSE, POCT (MANUAL RESULT ENTRY): POC Glucose: 105 mg/dl — AB (ref 70–99)

## 2017-11-27 NOTE — Progress Notes (Signed)
OFFICE VISIT  11/27/2017   CC:  Chief Complaint  Patient presents with  . Low blood sugar  . Low Blood Pressure   HPI:    Patient is a 62 y.o.  female who presents for concern of low blood sugar as well as low blood pressure.  Feeling run down last 2-3 weeks.  Husband is traveling and she does not sleep well during the times when he travels. BP check at home 90/70s, uses her husband's wrist cuff. Two days ago her friend checked her glucose about 7 hours after eating some cheerios and a biscuit-->gluc was 70. She did not feel shaky, lightheaded, sweaty, or any cognitive dysfunction at the time--she says the friend checked it b/c "my eyes looked tired". Couple weeks of L mid back pain, worse with walking.  Had similar pain a long time ago and it spontaneously resolve. Very slight diffuse achiness.    Has been in habit of eating meals spaced pretty close together.  She last ate 4.5 hours ago--a mediterranean bowl, a muffin top, and water.  Small square of chocolate for snack afterwards.  ROS: no orthostatic dizziness, recent worsening of urinary urge incontinence but no polydipsia.  NO HAs. No fever.  No joint swelling.  No rash.  NO blood in stool or urine.  No melena.  No vag bleeding.  No vag d/c. She is not a vegetarian.   Hydration is spotty: some days horribly low intake and other days more focus on fluids.   1 cup coffee qAM, no colas.   (2-3 wks) Her weight yo-yo's.  She walks for exercise. No depression, no panic.    She does snore but has not been told about anyone witnessing hypopnea or apneic event during sleep.  She does not feel like her sleep is restorative.  She does endorse uncontrolled daytime sleepiness. Quality of sleep questionnaire today=6 (high risk of OSA).    Past Medical History:  Diagnosis Date  . Diabetes mellitus without complication (HCC) 01/21/2013   hx well controlled  . Glaucoma 11-13  . Obesity, Class III, BMI 40-49.9 (morbid obesity) (HCC)     Going to [facility] to do their wt loss program as of 02/2013  . Tinnitus 06/14/2011   transient    Past Surgical History:  Procedure Laterality Date  . ABDOMINAL HYSTERECTOMY  1996   partial- still has ovaries, for fibroids, heavy bleeding  . COLONOSCOPY  approx 2009   in [place]: pt reports that it was NORMAL  . COLONOSCOPY  08/2016   NORMAL per pt  . KNEE SURGERY  1989   left, arthroscopy to clean up meniscus    Outpatient Medications Prior to Visit  Medication Sig Dispense Refill  . azelastine (ASTELIN) 0.1 % nasal spray 2 sprays in each nostril every 12 hours as needed for nasal congestion 30 mL 11  . latanoprost (XALATAN) 0.005 % ophthalmic solution     . Multiple Vitamins-Minerals (MULTIVITAMIN PO) Take 1 tablet by mouth daily.    . Probiotic Product (MISC INTESTINAL FLORA REGULAT) CHEW Digestive Health probiotics daily by Schiff  0  . timolol (TIMOPTIC) 0.5 % ophthalmic solution      No facility-administered medications prior to visit.     No Known Allergies  ROS As per HPI  PE: Initial bp today was 147/83, repeat was 132/86 Blood pressure 132/86, pulse 80, temperature 97.7 F (36.5 C), temperature source Oral, resp. rate 16, height 5\' 6"  (1.676 m), weight 282 lb 4 oz (128 kg),  SpO2 98 %. Body mass index is 45.56 kg/m.  Gen: Alert, well appearing.  Patient is oriented to person, place, time, and situation. AFFECT: pleasant, lucid thought and speech. CV: RRR, no m/r/g.   LUNGS: CTA bilat, nonlabored resps, good aeration in all lung fields. EXT: no clubbing or cyanosis.  Trace-1+ pittinge (bilat) edema.    LABS:  Lab Results  Component Value Date   TSH 2.243 05/02/2014      Chemistry      Component Value Date/Time   NA 142 05/02/2014 1552   K 4.3 05/02/2014 1552   CL 106 05/02/2014 1552   CO2 24 05/02/2014 1552   BUN 14 05/02/2014 1552   CREATININE 0.91 05/02/2014 1552      Component Value Date/Time   CALCIUM 9.6 05/02/2014 1552   ALKPHOS  68 05/02/2014 1552   AST 13 05/02/2014 1552   ALT 16 05/02/2014 1552   BILITOT 0.3 05/02/2014 1552     Lab Results  Component Value Date   HGBA1C 5.9 05/26/2017   CC UA today: 1+ blood, SG 1.030, otherwise normal.  POC glucose today= 105  IMPRESSION AND PLAN:  Fatigue: mild malaise.  I don't really think her glucose or bp is the issue. I'm more concerned about possible OSA. Referred to neuro today for further eval of this. Check CBC, CMET, and TSH as well. Her [facility] A[facility]c [facility] about 2 mo ago, so I did not repeat this today. She was told it was very good and she plans on getting these results to me. Sent urine for culture for completeness but UA not very concerning.  She is definitely overly tired and stressed/burnt out from living a very busy life.  Spent 45 min with pt today, with >50% of this time spent in counseling and care coordination regarding the above problems.  An After Visit Summary was printed and given to the patient.  FOLLOW UP: Return in about 3 months (around 02/26/2018) for routine chronic illness f/u.  Signed:  Santiago BumpersPhil Lulu Hirschmann, MD           11/27/2017

## 2017-11-28 LAB — CBC WITH DIFFERENTIAL/PLATELET
BASOS PCT: 1.4 % (ref 0.0–3.0)
Basophils Absolute: 0.1 10*3/uL (ref 0.0–0.1)
EOS ABS: 0.2 10*3/uL (ref 0.0–0.7)
Eosinophils Relative: 2.9 % (ref 0.0–5.0)
HEMATOCRIT: 40.2 % (ref 36.0–46.0)
HEMOGLOBIN: 13.2 g/dL (ref 12.0–15.0)
Lymphocytes Relative: 43.2 % (ref 12.0–46.0)
Lymphs Abs: 3.3 10*3/uL (ref 0.7–4.0)
MCHC: 32.9 g/dL (ref 30.0–36.0)
MCV: 90.2 fl (ref 78.0–100.0)
MONOS PCT: 6.3 % (ref 3.0–12.0)
Monocytes Absolute: 0.5 10*3/uL (ref 0.1–1.0)
NEUTROS ABS: 3.5 10*3/uL (ref 1.4–7.7)
Neutrophils Relative %: 46.2 % (ref 43.0–77.0)
PLATELETS: 258 10*3/uL (ref 150.0–400.0)
RBC: 4.46 Mil/uL (ref 3.87–5.11)
RDW: 14.8 % (ref 11.5–15.5)
WBC: 7.5 10*3/uL (ref 4.0–10.5)

## 2017-11-28 LAB — COMPREHENSIVE METABOLIC PANEL
ALK PHOS: 66 U/L (ref 39–117)
ALT: 14 U/L (ref 0–35)
AST: 12 U/L (ref 0–37)
Albumin: 4.2 g/dL (ref 3.5–5.2)
BUN: 19 mg/dL (ref 6–23)
CHLORIDE: 103 meq/L (ref 96–112)
CO2: 30 mEq/L (ref 19–32)
Calcium: 9.6 mg/dL (ref 8.4–10.5)
Creatinine, Ser: 0.84 mg/dL (ref 0.40–1.20)
GFR: 87.69 mL/min (ref >60.00)
GLUCOSE: 95 mg/dL (ref 70–99)
POTASSIUM: 4 meq/L (ref 3.5–5.1)
Sodium: 139 mEq/L (ref 135–145)
TOTAL PROTEIN: 7.5 g/dL (ref 6.0–8.3)
Total Bilirubin: 0.3 mg/dL (ref 0.2–1.2)

## 2017-11-28 LAB — TSH: TSH: 1.84 u[IU]/mL (ref 0.35–4.50)

## 2017-11-29 LAB — URINE CULTURE
MICRO NUMBER:: 91140939
SPECIMEN QUALITY:: ADEQUATE

## 2018-03-06 ENCOUNTER — Encounter: Payer: Self-pay | Admitting: Family Medicine

## 2018-03-06 ENCOUNTER — Ambulatory Visit: Payer: Managed Care, Other (non HMO) | Admitting: Family Medicine

## 2018-03-06 VITALS — BP 146/76 | HR 87 | Temp 98.1°F | Resp 16

## 2018-03-06 DIAGNOSIS — Z7184 Encounter for health counseling related to travel: Secondary | ICD-10-CM

## 2018-03-06 MED ORDER — MEFLOQUINE HCL 250 MG PO TABS: ORAL_TABLET | ORAL | 0 refills | Status: DC

## 2018-03-06 MED ORDER — AZITHROMYCIN 250 MG PO TABS: ORAL_TABLET | ORAL | 0 refills | Status: DC

## 2018-03-06 NOTE — Progress Notes (Signed)
OFFICE VISIT  03/06/2018   CC:  Chief Complaint  Patient presents with  . Travel Consult    needs malaria medication, traveling to [place]    HPI:    Patient is a 62 y.o.  female who presents for discussion of travel to [place]. Going to [place], leaving 03/22/18, will return 04/05/17.  Needs malaria proph med and a rx for traveler's diarrhea.    Her wt loss clinic can't rx her certain wt loss meds b/c of her glaucoma. She asks about alternative meds I could rx for wt loss OR diff wt loss clinic (like [facility])--we chose to save this discussion for the time of her CPE in 3 mo.  (She is overdue for screening mammogram AND I referred her to neurologist sleep MD for concern of OSA when I saw her here 3 mo ago).   Past Medical History:  Diagnosis Date  . Diabetes mellitus without complication (HCC) 01/21/2013   hx well controlled  . Glaucoma 11-13  . Obesity, Class III, BMI 40-49.9 (morbid obesity) (HCC)    [place] [facility] program as of 02/2013  . Tinnitus 06/14/2011   transient    Past Surgical History:  Procedure Laterality Date  . ABDOMINAL HYSTERECTOMY  1996   partial- still has ovaries, for fibroids, heavy bleeding  . [place]  approx 2009   [place]: pt reports that it was NORMAL  . COLONOSCOPY  08/2016   NORMAL per pt  . KNEE SURGERY  1989   left, arthroscopy to clean up meniscus    Outpatient Medications Prior to Visit  Medication Sig Dispense Refill  . latanoprost (XALATAN) 0.005 % ophthalmic solution     . timolol (TIMOPTIC) 0.5 % ophthalmic solution     . azelastine (ASTELIN) 0.1 % nasal spray 2 sprays in each nostril every 12 hours as needed for nasal congestion (Patient not taking: Reported on 03/06/2018) 30 mL 11  . Multiple Vitamins-Minerals (MULTIVITAMIN PO) Take 1 tablet by mouth daily.    . Probiotic Product (MISC INTESTINAL FLORA REGULAT) CHEW Digestive Health probiotics daily by  Schiff (Patient not taking: Reported on 03/06/2018)  0   No facility-administered medications prior to visit.     No Known Allergies  ROS As per HPI  PE: Blood pressure (!) 146/76, pulse 87, temperature 98.1 F (36.7 C), temperature source Oral, resp. rate 16, SpO2 95 %. Gen: Alert, well appearing.  Patient is oriented to person, place, time, and situation. AFFECT: pleasant, lucid thought and speech. No further exam today.  LABS:  Lab Results  Component Value Date   TSH 1.84 11/27/2017   Lab Results  Component Value Date   WBC 7.5 11/27/2017   HGB 13.2 11/27/2017   HCT 40.2 11/27/2017   MCV 90.2 11/27/2017   PLT 258.0 11/27/2017   Lab Results  Component Value Date   CREATININE 0.84 11/27/2017   BUN 19 11/27/2017   NA 139 11/27/2017   K 4.0 11/27/2017   CL 103 11/27/2017   CO2 30 11/27/2017   Lab Results  Component Value Date   ALT 14 11/27/2017   AST 12 11/27/2017   ALKPHOS 66 11/27/2017   BILITOT 0.3 11/27/2017   Lab Results  Component Value Date   CHOL 190 01/21/2013   Lab Results  Component Value Date   HDL 56 01/21/2013   Lab Results  Component Value Date   LDLCALC 97 01/21/2013   Lab Results  Component Value Date   TRIG 183 (H) 01/21/2013   Lab Results  Component Value Date   CHOLHDL 3.4 01/21/2013   Lab Results  Component Value Date   HGBA1C 5.9 05/26/2017    IMPRESSION AND PLAN:  Encounter for travel med advice: Going to [place]: mefloquine 250mg  q7d, start 1 wk prior to travel and continue for 4 weeks after return. Also, rx for traveler's diarrhea: azith 250mg , 2 tabs po qd x 3d prn.  An After Visit Summary was printed and given to the patient.  FOLLOW UP: Return in about 3 months (around 06/05/2018) for annual CPE (fasting).  Signed:  Santiago BumpersPhil Miangel Flom, MD           03/06/2018

## 2018-04-27 ENCOUNTER — Other Ambulatory Visit: Payer: Self-pay

## 2018-04-27 ENCOUNTER — Emergency Department (HOSPITAL_BASED_OUTPATIENT_CLINIC_OR_DEPARTMENT_OTHER)
Admission: EM | Admit: 2018-04-27 | Discharge: 2018-04-27 | Disposition: A | Discharge status: Home / Self Care | Payer: No Typology Code available for payment source | Attending: Emergency Medicine | Admitting: Emergency Medicine

## 2018-04-27 ENCOUNTER — Emergency Department (HOSPITAL_BASED_OUTPATIENT_CLINIC_OR_DEPARTMENT_OTHER): Payer: No Typology Code available for payment source

## 2018-04-27 ENCOUNTER — Encounter (HOSPITAL_BASED_OUTPATIENT_CLINIC_OR_DEPARTMENT_OTHER): Payer: Self-pay | Admitting: *Deleted

## 2018-04-27 DIAGNOSIS — S0181XA Laceration without foreign body of other part of head, initial encounter: Secondary | ICD-10-CM | POA: Diagnosis not present

## 2018-04-27 DIAGNOSIS — M25532 Pain in left wrist: Secondary | ICD-10-CM

## 2018-04-27 DIAGNOSIS — M25561 Pain in right knee: Secondary | ICD-10-CM

## 2018-04-27 DIAGNOSIS — W010XXA Fall on same level from slipping, tripping and stumbling without subsequent striking against object, initial encounter: Secondary | ICD-10-CM | POA: Diagnosis not present

## 2018-04-27 DIAGNOSIS — Y929 Unspecified place or not applicable: Secondary | ICD-10-CM | POA: Insufficient documentation

## 2018-04-27 DIAGNOSIS — Y9301 Activity, walking, marching and hiking: Secondary | ICD-10-CM | POA: Insufficient documentation

## 2018-04-27 DIAGNOSIS — Y999 Unspecified external cause status: Secondary | ICD-10-CM | POA: Insufficient documentation

## 2018-04-27 DIAGNOSIS — M25511 Pain in right shoulder: Secondary | ICD-10-CM

## 2018-04-27 DIAGNOSIS — E119 Type 2 diabetes mellitus without complications: Secondary | ICD-10-CM | POA: Diagnosis not present

## 2018-04-27 DIAGNOSIS — S0990XA Unspecified injury of head, initial encounter: Secondary | ICD-10-CM | POA: Diagnosis present

## 2018-04-27 MED ORDER — OXYCODONE-ACETAMINOPHEN 5-325 MG PO TABS
1.0000 | ORAL_TABLET | Freq: Once | ORAL | Status: DC
  Filled 2018-04-27: qty 1

## 2018-04-27 MED ORDER — OXYCODONE-ACETAMINOPHEN 5-325 MG PO TABS: 1.0000 | ORAL_TABLET | Freq: Four times a day (QID) | ORAL | 0 refills | Status: DC | PRN

## 2018-04-27 MED ORDER — IBUPROFEN 400 MG PO TABS
600.0000 mg | ORAL_TABLET | Freq: Once | ORAL | Status: AC
  Administered 2018-04-27: 600 mg via ORAL
  Filled 2018-04-27: qty 1

## 2018-04-27 NOTE — ED Notes (Signed)
New glasses caused pt to fall has minot  2 inch lac to foreheah w some abrasions,  Lt wrist pain, rt shoulder soreness, rt knee pain w abrasion  Denies loc

## 2018-04-27 NOTE — ED Triage Notes (Signed)
She tripped and fell this am. Laceration to her forehead. Bleeding controlled. Swelling and pain to her right knee. Her arms are stiff from landing on her arms to break her fall.

## 2018-04-27 NOTE — ED Provider Notes (Signed)
Emergency Department Provider Note   I have reviewed the triage vital signs and the nursing notes.   HISTORY  Chief Complaint Fall and Laceration   HPI Sheri Liu is a 63 y.o. female presents to the emergency department for evaluation of mechanical fall with head trauma and pain in the right shoulder, knee, left wrist.  Patient states that she was walking when she tripped and fell.  She did sustain some head trauma but no loss of consciousness.  No confusion or vomiting since the incident.  She denies any pain in the neck.  No presyncopal symptoms prior to fall.  She denies any chest pain or shortness of breath.  She has a area of bleeding over the forehead along with pain in the right shoulder, right knee, left wrist.  No lower back pain.  Pain is worse with movement.  Patient last had tetanus shot in 2018.   Past Medical History:  Diagnosis Date  . Diabetes mellitus without complication (HCC) 01/21/2013   hx well controlled  . Glaucoma 11-13  . Obesity, Class III, BMI 40-49.9 (morbid obesity) (HCC)    [facility] as of 02/2013  . Tinnitus 06/14/2011   transient    Patient Active Problem List   Diagnosis Date Noted  . Primary open-angle glaucoma 10/23/2015  . Left carpal tunnel syndrome 09/17/2015  . Hypertriglyceridemia 02/23/2015  . Pre-diabetes 02/23/2015  . Osteoarthritis of finger of right hand 06/12/2014  . Insomnia 05/02/2014  . Other fatigue 05/02/2014  . Morbid obesity with BMI of 40.0-44.9, adult (HCC) 07/24/2013  . Impaired glucose tolerance 01/21/2013  . Glaucoma   . Preventative health care 06/14/2011    Past Surgical History:  Procedure Laterality Date  . ABDOMINAL HYSTERECTOMY  1996   partial- still has ovaries, for fibroids, heavy bleeding  . [place]  approx [place] reports that it was NORMAL  . COLONOSCOPY  08/2016   NORMAL per pt  . KNEE SURGERY  1989   left, arthroscopy to  clean up meniscus    Allergies Patient has no known allergies.  Family History  Problem Relation Age of Onset  . Diabetes Mother [id number]       type 2  . Heart disease Mother        pacemaker  . Obesity Mother   . Hypertension Mother   . Cancer Father [id number]       stomach  . Alcohol abuse Brother   . Diabetes Maternal Grandmother        type 2  . Heart disease Maternal Grandmother        pacer  . Diabetes Brother [id number]  . Heart attack Neg Hx   . Hyperlipidemia Neg Hx   . Sudden death Neg Hx     Social History Social History   Tobacco Use  . Smoking status: Never Smoker  . Smokeless tobacco: Never Used  Substance Use Topics  . Alcohol use: No  . Drug use: No    Review of Systems  Constitutional: No fever/chills Eyes: No visual changes. ENT: No sore throat. Cardiovascular: Denies chest pain. Respiratory: Denies shortness of breath. Gastrointestinal: No abdominal pain.  No nausea, no vomiting.  No diarrhea.  No constipation. Genitourinary: Negative for dysuria. Musculoskeletal: Positive right shoulder and right knee pain. Positive left wrist pain.  Skin: Negative for rash. Positive forehead laceration.  Neurological: Negative for focal weakness or numbness. Positive HA.  10-point ROS otherwise negative.  ____________________________________________   PHYSICAL EXAM:  VITAL SIGNS: ED Triage Vitals  Enc Vitals Group     BP 04/27/18 1209 (!) 151/131     Pulse Rate 04/27/18 1209 78     Resp 04/27/18 1209 20     Temp 04/27/18 1209 98.1 F (36.7 C)     Temp Source 04/27/18 1209 Oral     SpO2 04/27/18 1209 100 %     Weight 04/27/18 1209 250 lb (113.4 kg)     Height 04/27/18 1209 5\' 6"  (1.676 m)     Pain Score 04/27/18 1218 5   Constitutional: Alert and oriented. Well appearing and in no acute distress. Eyes: Conjunctivae are normal. PERRL. Head: 5 cm linear abrasion to the forehead to the forehead.  Nose: No congestion/rhinnorhea. Mouth/Throat: Mucous membranes  are moist.  Neck: No stridor.  No cervical spine tenderness to palpation. Cardiovascular: Normal rate, regular rhythm. Good peripheral circulation. Grossly normal heart sounds.   Respiratory: Normal respiratory effort.  No retractions. Lungs CTAB. Gastrointestinal: Soft and nontender. No distention.  Musculoskeletal: No lower extremity tenderness nor edema. No gross deformities of extremities. Mild pain with ROM of the right shoulder and knee. No deformity. No wrist tenderness or edema bilaterally. No elbow tenderness. Normal ROM of the hips and elbows.  Neurologic:  Normal speech and language. No gross focal neurologic deficits are appreciated.  Skin:  Skin is warm, dry and intact. No rash noted.  ____________________________________________  RADIOLOGY  Dg Shoulder Right  Result Date: 04/27/2018 CLINICAL DATA:  63 year old who fell down a flight of stairs earlier today. RIGHT shoulder pain. Initial encounter. EXAM: RIGHT SHOULDER - 2+ VIEW COMPARISON:  None. FINDINGS: No evidence of acute fracture. Glenohumeral joint anatomically aligned with well-preserved joint space. Subacromial space well-preserved. Acromioclavicular joint intact with degenerative changes. IMPRESSION: No acute osseous abnormality. Degenerative changes involving the [facility] joint. Electronically Signed   By: Hulan Saas M.D.   On: 04/27/2018 14:43   Dg Wrist Complete Left  Result Date: 04/27/2018 CLINICAL DATA:  63 year old who fell down a flight of stairs earlier today. LEFT wrist injury. Initial encounter. EXAM: LEFT WRIST - COMPLETE 3+ VIEW COMPARISON:  None. FINDINGS: No evidence of acute fracture or dislocation. Joint spaces well preserved. Well-preserved bone mineral density. No intrinsic osseous abnormalities. IMPRESSION: Normal examination. Electronically Signed   By: Hulan Saas M.D.   On: 04/27/2018 14:46   Ct Head Wo Contrast  Result Date: 04/27/2018 CLINICAL DATA:  Status post fall with laceration to the  left side of the forehead. EXAM: CT HEAD WITHOUT CONTRAST TECHNIQUE: Contiguous axial images were obtained from the base of the skull through the vertex without intravenous contrast. COMPARISON:  None. FINDINGS: Brain: No evidence of acute infarction, hemorrhage, hydrocephalus, extra-axial collection or mass lesion/mass effect. Vascular: No hyperdense vessel or unexpected calcification. Skull: Normal. Negative for fracture or focal lesion. Sinuses/Orbits: No acute finding. Other: Right frontal scalp hematoma. IMPRESSION: 1. No acute intracranial abnormality. 2. Right frontal scalp hematoma. Electronically Signed   By: Ted Mcalpine M.D.   On: 04/27/2018 14:26   Dg Knee Complete 4 Views Right  Result Date: 04/27/2018 CLINICAL DATA:  63 year old who fell down a flight of stairs earlier today. RIGHT knee injury. Initial encounter. EXAM: RIGHT KNEE - COMPLETE 4+ VIEW COMPARISON:  None. FINDINGS: No evidence of acute fracture or dislocation. Well-preserved bone mineral density. Mild MEDIAL and LATERAL compartment joint space narrowing with associated spurring and moderate patellofemoral compartment joint space narrowing.  Spurring along the undersurface of the patella. Small enthesopathic spur at the insertion of the quadriceps tendon on the SUPERIOR patella. No visible joint effusion. IMPRESSION: No acute osseous abnormality. Mild tricompartment osteoarthritis. Electronically Signed   By: Hulan Saas M.D.   On: 04/27/2018 14:45    ____________________________________________   PROCEDURES  Procedure(s) performed:   Marland KitchenMarland KitchenLaceration Repair Date/Time: 04/27/2018 8:18 PM Performed by: Maia Plan, MD Authorized by: Maia Plan, MD   Consent:    Consent obtained:  Verbal   Consent given by:  Patient   Risks discussed:  Infection, nerve damage, need for additional repair, pain, poor cosmetic result, poor wound healing, retained foreign body and vascular damage   Alternatives discussed:  No  treatment Anesthesia (see MAR for exact dosages):    Anesthesia method:  None Laceration details:    Location:  Face   Face location:  Forehead   Length (cm):  5 Repair type:    Repair type:  Simple Exploration:    Hemostasis achieved with:  Direct pressure   Wound exploration: entire depth of wound probed and visualized     Wound extent: no foreign bodies/material noted, no nerve damage noted and no underlying fracture noted     Contaminated: no   Treatment:    Area cleansed with:  Betadine and saline   Amount of cleaning:  Standard   Visualized foreign bodies/material removed: no   Skin repair:    Repair method:  Steri-Strips   Number of Steri-Strips:  3 Approximation:    Approximation:  Close Post-procedure details:    Dressing:  Open (no dressing)   Patient tolerance of procedure:  Tolerated well, no immediate complications     ____________________________________________   INITIAL IMPRESSION / ASSESSMENT AND PLAN / ED COURSE  Pertinent labs & imaging results that were available during my care of the patient were reviewed by me and considered in my medical decision making (see chart for details).  Patient presents emergency department after mechanical fall.  Imaging of the right shoulder, knee, left wrist are normal.  Patient with normal range of motion of these areas.  No scaphoid tenderness of the left wrist.  Very low suspicion for occult fracture.  We will not provide splinting.  Patient has a very superficial laceration to the forehead.  This is not gaping and there is no significant separation of the dermis.  There is an area of avulsion noted and bacitracin was applied.  I will provide additional support for the wound with Steri-Strips.  Discussed wound care and follow-up plan.   ____________________________________________  FINAL CLINICAL IMPRESSION(S) / ED DIAGNOSES  Final diagnoses:  Injury of head, initial encounter  Facial laceration, initial encounter    Acute pain of right shoulder  Acute pain of right knee  Left wrist pain     MEDICATIONS GIVEN DURING THIS VISIT:  Medications  ibuprofen (ADVIL,MOTRIN) tablet 600 mg (600 mg Oral Given 04/27/18 1507)     NEW OUTPATIENT MEDICATIONS STARTED DURING THIS VISIT:  Discharge Medication List as of 04/27/2018  3:15 PM    START taking these medications   Details  oxyCODONE-acetaminophen (PERCOCET/ROXICET) 5-325 MG tablet Take 1 tablet by mouth every 6 (six) hours as needed for severe pain., Starting Fri 04/27/2018, Normal        Note:  This document was prepared using Dragon voice recognition software and may include unintentional dictation errors.  Alona Bene, MD Emergency Medicine    Amillia Biffle, Arlyss Repress, MD 04/27/18 2020

## 2018-04-27 NOTE — Discharge Instructions (Signed)

## 2018-04-27 NOTE — ED Notes (Signed)
Motrin offered but refused at this time. ?

## 2018-05-22 ENCOUNTER — Encounter: Payer: Self-pay | Admitting: Family Medicine

## 2019-01-06 DIAGNOSIS — E7800 Pure hypercholesterolemia, unspecified: Secondary | ICD-10-CM

## 2019-01-06 HISTORY — DX: Pure hypercholesterolemia, unspecified: E78.00

## 2019-01-08 ENCOUNTER — Ambulatory Visit: Payer: BC Managed Care – PPO | Admitting: Family Medicine

## 2019-01-08 ENCOUNTER — Encounter: Payer: Self-pay | Admitting: Family Medicine

## 2019-01-08 ENCOUNTER — Other Ambulatory Visit: Payer: Self-pay

## 2019-01-08 VITALS — BP 117/77 | HR 80 | Temp 98.4°F | Resp 16 | Ht 66.0 in

## 2019-01-08 DIAGNOSIS — E119 Type 2 diabetes mellitus without complications: Secondary | ICD-10-CM | POA: Diagnosis not present

## 2019-01-08 DIAGNOSIS — Z23 Encounter for immunization: Secondary | ICD-10-CM | POA: Diagnosis not present

## 2019-01-08 LAB — CBC WITH DIFFERENTIAL/PLATELET
Basophils Absolute: 0.1 10*3/uL (ref 0.0–0.1)
Basophils Relative: 1.5 % (ref 0.0–3.0)
Eosinophils Absolute: 0.2 10*3/uL (ref 0.0–0.7)
Eosinophils Relative: 2.7 % (ref 0.0–5.0)
HCT: 41.9 % (ref 36.0–46.0)
Hemoglobin: 13.8 g/dL (ref 12.0–15.0)
Lymphocytes Relative: 46.3 % — ABNORMAL HIGH (ref 12.0–46.0)
Lymphs Abs: 2.8 10*3/uL (ref 0.7–4.0)
MCHC: 32.9 g/dL (ref 30.0–36.0)
MCV: 91.4 fl (ref 78.0–100.0)
Monocytes Absolute: 0.4 10*3/uL (ref 0.1–1.0)
Monocytes Relative: 6.6 % (ref 3.0–12.0)
Neutro Abs: 2.6 10*3/uL (ref 1.4–7.7)
Neutrophils Relative %: 42.9 % — ABNORMAL LOW (ref 43.0–77.0)
Platelets: 228 10*3/uL (ref 150.0–400.0)
RBC: 4.58 Mil/uL (ref 3.87–5.11)
RDW: 15.2 % (ref 11.5–15.5)
WBC: 6 10*3/uL (ref 4.0–10.5)

## 2019-01-08 LAB — MICROALBUMIN / CREATININE URINE RATIO
Creatinine,U: 163.4 mg/dL
Microalb Creat Ratio: 0.5 mg/g (ref 0.0–30.0)
Microalb, Ur: 0.8 mg/dL (ref 0.0–1.9)

## 2019-01-08 LAB — COMPREHENSIVE METABOLIC PANEL
ALT: 15 U/L (ref 0–35)
AST: 13 U/L (ref 0–37)
Albumin: 4.3 g/dL (ref 3.5–5.2)
Alkaline Phosphatase: 74 U/L (ref 39–117)
BUN: 12 mg/dL (ref 6–23)
CO2: 27 mEq/L (ref 19–32)
Calcium: 9.1 mg/dL (ref 8.4–10.5)
Chloride: 106 mEq/L (ref 96–112)
Creatinine, Ser: 0.82 mg/dL (ref 0.40–1.20)
GFR: 84.53 mL/min (ref >60.00)
Glucose, Bld: 94 mg/dL (ref 70–99)
Potassium: 4.2 mEq/L (ref 3.5–5.1)
Sodium: 140 mEq/L (ref 135–145)
Total Bilirubin: 0.4 mg/dL (ref 0.2–1.2)
Total Protein: 7.2 g/dL (ref 6.0–8.3)

## 2019-01-08 LAB — TSH: TSH: 2.07 u[IU]/mL (ref 0.35–4.50)

## 2019-01-08 LAB — LIPID PANEL
Cholesterol: 196 mg/dL (ref 0–200)
HDL: 56 mg/dL (ref >39.00)
LDL Cholesterol: 121 mg/dL — ABNORMAL HIGH (ref 0–99)
NonHDL: 140.43
Total CHOL/HDL Ratio: 4
Triglycerides: 99 mg/dL (ref 0.0–149.0)
VLDL: 19.8 mg/dL (ref 0.0–40.0)

## 2019-01-08 NOTE — Progress Notes (Signed)
OFFICE VISIT  01/08/2019   CC:  Chief Complaint  Patient presents with  . Follow-up    Diabetes Mellitus 2   HPI:    Patient is a 63 y.o. African-American female who presents for f/u DM 2.  It has been 18 months since her last diabetes f/u. She is happy and doing well.  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 Walks 3 miles 4-5 days/week. Gained 15 lbs initially in covid, then says she lost it again. Diet: low fat meats, lots of veggies and fruits.  DM: doesn't monitor home glucoses. No burning, tingling, or numbness in feet.  ROS: no CP, no SOB, no wheezing, no cough, no dizziness, no HAs, no rashes, no melena/hematochezia.  No polyuria or polydipsia.  No myalgias or arthralgias.   Past Medical History:  Diagnosis Date  . Diabetes mellitus without complication (HCC) 01/21/2013   hx well controlled  . Glaucoma 11-13  . Obesity, Class III, BMI 40-49.9 (morbid obesity) (HCC)    Going to [facility] to do their wt loss program as of 02/2013  . Tinnitus 06/14/2011   transient    Past Surgical History:  Procedure Laterality Date  . ABDOMINAL HYSTERECTOMY  1996   partial- still has ovaries, for fibroids, heavy bleeding  . [place]OSCOPY  approx 2009   in [place]: pt reports that it was NORMAL  . COLONOSCOPY  08/2016   NORMAL per pt  . KNEE SURGERY  1989   left, arthroscopy to clean up meniscus    Outpatient Medications Prior to Visit  Medication Sig Dispense Refill  . latanoprost (XALATAN) 0.005 % ophthalmic solution     . timolol (TIMOPTIC) 0.5 % ophthalmic solution     . mefloquine (LARIAM) 250 MG tablet 1 tab po q 7 days.  Start 7 d prior to travel, and continue for 4 weeks after return. (Patient not taking: Reported on 01/08/2019) 8 tablet 0  . azithromycin (ZITHROMAX) 250 MG tablet 2 tabs po qd x 3d prn traveler's diarrhea (Patient not taking: Reported on 01/08/2019) 6 tablet 0  . oxyCODONE-acetaminophen (PERCOCET/ROXICET) 5-325 MG tablet Take 1 tablet by mouth every 6  (six) hours as needed for severe pain. (Patient not taking: Reported on 01/08/2019) 5 tablet 0   No facility-administered medications prior to visit.     No Known Allergies  ROS As per HPI  PE: Blood pressure 117/77, pulse 80, temperature 98.4 F (36.9 C), temperature source Temporal, resp. rate 16, height 5\' 6"  (1.676 m), SpO2 99 %. Pt refused to be weighed today.  Gen: Alert, well appearing.  Patient is oriented to person, place, time, and situation. AFFECT: pleasant, lucid thought and speech. CV: RRR, no m/r/g.   LUNGS: CTA bilat, nonlabored resps, good aeration in all lung fields. EXT: no clubbing or cyanosis.  no edema.  Foot exam - bilateral normal; no swelling, tenderness or skin or vascular lesions. Color and temperature is normal. Sensation is intact. Peripheral pulses are palpable. Toenails are normal.   LABS:  Lab Results  Component Value Date   TSH 1.84 11/27/2017   Lab Results  Component Value Date   WBC 7.5 11/27/2017   HGB 13.2 11/27/2017   HCT 40.2 11/27/2017   MCV 90.2 11/27/2017   PLT 258.0 11/27/2017   Lab Results  Component Value Date   CREATININE 0.84 11/27/2017   BUN 19 11/27/2017   NA 139 11/27/2017   K 4.0 11/27/2017   CL 103 11/27/2017   CO2 30 11/27/2017   Lab  Results  Component Value Date   ALT 14 11/27/2017   AST 12 11/27/2017   ALKPHOS 66 11/27/2017   BILITOT 0.3 11/27/2017   Lab Results  Component Value Date   CHOL 190 01/21/2013   Lab Results  Component Value Date   HDL 56 01/21/2013   Lab Results  Component Value Date   LDLCALC 97 01/21/2013   Lab Results  Component Value Date   TRIG 183 (H) 01/21/2013   Lab Results  Component Value Date   CHOLHDL 3.4 01/21/2013   Lab Results  Component Value Date   HGBA1C 5.9 05/26/2017    IMPRESSION AND PLAN:  Diet-controlled DM 2. Wt is stable per pt. Diet seems fine.  She is active but not with particularly high calorie-burning exercise. Will check HbA1c and urine  microalb/cr today. Feet exam normal today.  She gets eyes examined regularly (has glaucoma). Will screen for hyperlipidemia and hypothyroidism today, as well as check CBC and metabolic panel.  Preventative health care: flu vaccine and pneumovax 23 given today.  An After Visit Summary was printed and given to the patient.  FOLLOW UP: Return in about 6 months (around 07/08/2019) for annual CPE (fasting).  Signed:  Crissie Sickles, MD           01/08/2019

## 2019-01-08 NOTE — Addendum Note (Signed)
Addended by: Deveron Furlong D on: 01/08/2019 09:18 AM   Modules accepted: Orders

## 2019-01-09 ENCOUNTER — Encounter: Payer: Self-pay | Admitting: Family Medicine

## 2019-01-09 DIAGNOSIS — L820 Inflamed seborrheic keratosis: Secondary | ICD-10-CM | POA: Diagnosis not present

## 2019-01-09 DIAGNOSIS — L821 Other seborrheic keratosis: Secondary | ICD-10-CM | POA: Diagnosis not present

## 2019-01-09 LAB — HEMOGLOBIN A1C: Hgb A1c MFr Bld: 6.4 % (ref 4.6–6.5)

## 2019-01-10 ENCOUNTER — Other Ambulatory Visit: Payer: Self-pay

## 2019-01-10 DIAGNOSIS — E7800 Pure hypercholesterolemia, unspecified: Secondary | ICD-10-CM

## 2019-01-10 DIAGNOSIS — E781 Pure hyperglyceridemia: Secondary | ICD-10-CM

## 2019-04-05 DIAGNOSIS — H401130 Primary open-angle glaucoma, bilateral, stage unspecified: Secondary | ICD-10-CM | POA: Diagnosis not present

## 2019-04-12 ENCOUNTER — Ambulatory Visit: Payer: Managed Care, Other (non HMO)

## 2019-04-18 ENCOUNTER — Ambulatory Visit: Payer: Managed Care, Other (non HMO)

## 2019-05-04 DIAGNOSIS — Z23 Encounter for immunization: Secondary | ICD-10-CM | POA: Diagnosis not present

## 2019-05-25 DIAGNOSIS — Z23 Encounter for immunization: Secondary | ICD-10-CM | POA: Diagnosis not present

## 2019-07-09 ENCOUNTER — Encounter: Payer: Managed Care, Other (non HMO) | Admitting: Family Medicine

## 2019-07-19 DIAGNOSIS — R1084 Generalized abdominal pain: Secondary | ICD-10-CM | POA: Diagnosis not present

## 2019-07-25 ENCOUNTER — Encounter: Payer: Self-pay | Admitting: Gastroenterology

## 2019-08-06 DIAGNOSIS — M9905 Segmental and somatic dysfunction of pelvic region: Secondary | ICD-10-CM | POA: Diagnosis not present

## 2019-08-06 DIAGNOSIS — M9902 Segmental and somatic dysfunction of thoracic region: Secondary | ICD-10-CM | POA: Diagnosis not present

## 2019-08-06 DIAGNOSIS — M21752 Unequal limb length (acquired), left femur: Secondary | ICD-10-CM | POA: Diagnosis not present

## 2019-08-06 DIAGNOSIS — M9901 Segmental and somatic dysfunction of cervical region: Secondary | ICD-10-CM | POA: Diagnosis not present

## 2019-08-16 ENCOUNTER — Ambulatory Visit: Payer: Managed Care, Other (non HMO) | Admitting: Nurse Practitioner

## 2019-08-16 DIAGNOSIS — M9905 Segmental and somatic dysfunction of pelvic region: Secondary | ICD-10-CM | POA: Diagnosis not present

## 2019-08-16 DIAGNOSIS — M9901 Segmental and somatic dysfunction of cervical region: Secondary | ICD-10-CM | POA: Diagnosis not present

## 2019-08-16 DIAGNOSIS — M9902 Segmental and somatic dysfunction of thoracic region: Secondary | ICD-10-CM | POA: Diagnosis not present

## 2019-08-16 DIAGNOSIS — M21752 Unequal limb length (acquired), left femur: Secondary | ICD-10-CM | POA: Diagnosis not present

## 2019-09-16 DIAGNOSIS — J069 Acute upper respiratory infection, unspecified: Secondary | ICD-10-CM | POA: Diagnosis not present

## 2019-09-16 DIAGNOSIS — R05 Cough: Secondary | ICD-10-CM | POA: Diagnosis not present

## 2019-09-18 ENCOUNTER — Ambulatory Visit: Payer: Managed Care, Other (non HMO) | Admitting: Gastroenterology

## 2019-09-19 DIAGNOSIS — Z03818 Encounter for observation for suspected exposure to other biological agents ruled out: Secondary | ICD-10-CM | POA: Diagnosis not present

## 2019-09-19 DIAGNOSIS — Z20822 Contact with and (suspected) exposure to covid-19: Secondary | ICD-10-CM | POA: Diagnosis not present

## 2019-10-16 ENCOUNTER — Encounter: Payer: Self-pay | Admitting: Gastroenterology

## 2019-10-16 ENCOUNTER — Ambulatory Visit: Payer: BC Managed Care – PPO | Admitting: Gastroenterology

## 2019-10-16 VITALS — BP 130/70 | HR 88 | Ht 64.25 in | Wt 277.5 lb

## 2019-10-16 DIAGNOSIS — K219 Gastro-esophageal reflux disease without esophagitis: Secondary | ICD-10-CM | POA: Diagnosis not present

## 2019-10-16 DIAGNOSIS — R1013 Epigastric pain: Secondary | ICD-10-CM

## 2019-10-16 NOTE — Patient Instructions (Addendum)
You have been scheduled for an endoscopy. Please follow written instructions given to you at your visit today. If you use inhalers (even only as needed), please bring them with you on the day of your procedure.   If you are age 65 or older, your body mass index should be between 23-30. Your Body mass index is 47.26 kg/m. If this is out of the aforementioned range listed, please consider follow up with your Primary Care Provider.  If you are age 75 or younger, your body mass index should be between 19-25. Your Body mass index is 47.26 kg/m. If this is out of the aformentioned range listed, please consider follow up with your Primary Care Provider.    Due to recent changes in healthcare laws, you may see the results of your imaging and laboratory studies on MyChart before your provider has had a chance to review them.  We understand that in some cases there may be results that are confusing or concerning to you. Not all laboratory results come back in the same time frame and the provider may be waiting for multiple results in order to interpret others.  Please give [place] 48 hours in order for your provider to thoroughly review all the results before contacting the office for clarification of your results.   Take FDGard and IBGard three times a day as needed  We will try to obtain your colonoscopy report from 2018  I appreciate the  opportunity to care for you  Thank You   Marsa Aris , MD

## 2019-10-16 NOTE — Progress Notes (Signed)
Sheri Liu    [id number]    1955-03-23  Primary Care Physician:McGowen, Maryjean Morn, MD  Referring Physician: Jeoffrey Massed, MD [address] [place],  [place] [address]   Chief complaint:  GERD, abdominal bloating epigastric pain  HPI: 64 year old very pleasant female here for new patient visit with complaints of GERD, abdominal bloating, epigastric pain and excessive gas Raw salad triggers episodes, OTC didn't help and she had to go to urgent care for severe epigastric and retrosternal chest pain.  She was worried about heart attack.  Cardiac work-up negative.  Denies any vomiting, dysphagia, odynophagia, melena, rectal bleeding or unintentional weight loss  Colonoscopy 2006: Normal  Colonoscopy 09/09/2016: Normal per patient, report not available to review  Outpatient Encounter Medications as of 10/16/2019  Medication Sig  . brimonidine (ALPHAGAN P) 0.1 % SOLN Place 1 drop into both eyes 3 times daily.  . dorzolamide-timolol (COSOPT) 22.3-6.8 MG/ML ophthalmic solution Place 1 drop into both eyes 2 (two) times daily.  Marland Kitchen latanoprost (XALATAN) 0.005 % ophthalmic solution   . [DISCONTINUED] mefloquine (LARIAM) 250 MG tablet 1 tab po q 7 days.  Start 7 d prior to travel, and continue for 4 weeks after return. (Patient not taking: Reported on 01/08/2019)  . [DISCONTINUED] timolol (TIMOPTIC) 0.5 % ophthalmic solution    No facility-administered encounter medications on file as of 10/16/2019.    Allergies as of 10/16/2019  . (No Known Allergies)    Past Medical History:  Diagnosis Date  . Diabetes mellitus without complication (HCC) 01/21/2013   hx well controlled  . Glaucoma 11-13  . Hypercholesterolemia 01/2019   recommended statin 01/2019  . Obesity, Class III, BMI 40-49.9 (morbid obesity) (HCC)    Going to [facility] to do their wt loss program as of 02/2013  . Tinnitus 06/14/2011   transient    Past Surgical History:  Procedure  Laterality Date  . ABDOMINAL HYSTERECTOMY  1996   partial- still has ovaries, for fibroids, heavy bleeding  . COLONOSCOPY  approx 2009   in [place]: pt reports that it was NORMAL  . COLONOSCOPY  08/2016   NORMAL per pt  . KNEE ARTHROSCOPY Left 1989   left, arthroscopy to clean up meniscus    Family History  Problem Relation Age of Onset  . Diabetes Mother 63       type 2  . Heart disease Mother        pacemaker  . Obesity Mother   . Hypertension Mother   . Uterine cancer Mother   . Stomach cancer Father 3       drinker  . Alcohol abuse Brother   . Diabetes Maternal Grandmother        type 2  . Heart disease Maternal Grandmother        pacer  . Diabetes Brother 75  . Ovarian cancer Maternal Aunt   . Heart attack Neg Hx   . Hyperlipidemia Neg Hx   . Sudden death Neg Hx     Social History   Socioeconomic History  . Marital status: Married    Spouse name: Not on file  . Number of children: 0  . Years of education: Not on file  . Highest education level: Not on file  Occupational History  . Occupation: Optician, dispensing  Tobacco Use  . Smoking status: Never Smoker  . Smokeless tobacco: Never Used  Vaping Use  . Vaping Use: Never used  Substance and Sexual Activity  . Alcohol use: No  . Drug use: No  . Sexual activity: Yes    Partners: Male  Other Topics Concern  . Not on file  Social History Narrative   Married, has 2 children (one adopted and one step).   Occupation: IT   No tobacco.  No alcohol/drugs.   Tries to "be good" and eat a heart healthy diet.   Social Determinants of Health   Financial Resource Strain:   . Difficulty of Paying Living Expenses:   Food Insecurity:   . Worried About Programme researcher, broadcasting/film/video in the Last Year:   . Barista in the Last Year:   Transportation Needs:   . Freight forwarder (Medical):   Marland Kitchen Lack of Transportation (Non-Medical):   Physical Activity:   . Days of Exercise per Week:   . Minutes of Exercise per  Session:   Stress:   . Feeling of Stress :   Social Connections:   . Frequency of Communication with Friends and Family:   . Frequency of Social Gatherings with Friends and Family:   . Attends Religious Services:   . Active Member of Clubs or Organizations:   . Attends Banker Meetings:   Marland Kitchen Marital Status:   Intimate Partner Violence:   . Fear of Current or Ex-Partner:   . Emotionally Abused:   Marland Kitchen Physically Abused:   . Sexually Abused:       Review of systems: All other review of systems negative except as mentioned in the HPI.   Physical Exam: Vitals:   10/16/19 1015  BP: 130/70  Pulse: 88   Body mass index is 47.26 kg/m. Gen:      No acute distress HEENT:  sclera anicteric Abd:      soft, non-tender; no palpable masses, no distension Ext:    No edema Neuro: alert and oriented x 3 Psych: normal mood and affect  Data Reviewed:  Reviewed labs, radiology imaging, old records and pertinent past GI work up   Assessment and Plan/Recommendations:  64 year old very pleasant female with obesity, hyper lipidemia with complaints of epigastric abdominal pain, excessive bloating and gas  Schedule for EGD to exclude peptic ulcer disease or  erosive esophagitis or gastropathy Check for H. pylori to exclude H. pylori related dyspepsia  Trial of FD guard and IBgard 1 capsule up to 3 times daily as needed for dyspepsia and IBS symptoms  The risks and benefits as well as alternatives of endoscopic procedure(s) have been discussed and reviewed. All questions answered. The patient agrees to proceed.  Colorectal cancer screening: Average risk Due for colonoscopy 2028.  Will obtain records of prior colonoscopy   The patient was provided an opportunity to ask questions and all were answered. The patient agreed with the plan and demonstrated an understanding of the instructions.  Iona Beard , MD    CC: McGowen, Maryjean Morn, MD

## 2019-10-22 ENCOUNTER — Encounter: Payer: Self-pay | Admitting: Gastroenterology

## 2019-10-30 ENCOUNTER — Encounter: Payer: Self-pay | Admitting: Gastroenterology

## 2019-11-01 ENCOUNTER — Telehealth: Payer: Self-pay | Admitting: Gastroenterology

## 2019-11-01 ENCOUNTER — Encounter: Payer: BC Managed Care – PPO | Admitting: Gastroenterology

## 2019-11-01 DIAGNOSIS — H2513 Age-related nuclear cataract, bilateral: Secondary | ICD-10-CM | POA: Diagnosis not present

## 2019-11-01 DIAGNOSIS — H402211 Chronic angle-closure glaucoma, right eye, mild stage: Secondary | ICD-10-CM | POA: Diagnosis not present

## 2019-11-01 DIAGNOSIS — H40033 Anatomical narrow angle, bilateral: Secondary | ICD-10-CM | POA: Diagnosis not present

## 2019-11-01 DIAGNOSIS — H402223 Chronic angle-closure glaucoma, left eye, severe stage: Secondary | ICD-10-CM | POA: Diagnosis not present

## 2019-11-22 DIAGNOSIS — H2513 Age-related nuclear cataract, bilateral: Secondary | ICD-10-CM | POA: Diagnosis not present

## 2019-11-22 DIAGNOSIS — H2511 Age-related nuclear cataract, right eye: Secondary | ICD-10-CM | POA: Diagnosis not present

## 2019-11-22 DIAGNOSIS — H401131 Primary open-angle glaucoma, bilateral, mild stage: Secondary | ICD-10-CM | POA: Diagnosis not present

## 2020-05-05 ENCOUNTER — Telehealth: Payer: Self-pay

## 2020-05-05 NOTE — Telephone Encounter (Signed)
Signed and put in box to go up front. Signed:  Santiago Bumpers, MD           05/05/2020

## 2020-05-05 NOTE — Telephone Encounter (Signed)
Original form received from 2/28 that was faxed. Placed on PCP desk to review and sign, if appropriate.

## 2020-05-05 NOTE — Telephone Encounter (Signed)
[facility] calling to verify fax was received yesterday regarding patient participating in a study.  I asked receptionist to fax document again because I did not see anything in patient's chart stating we had received document requiring Dr. Samul Dada approval to participate in a study.  She verified our fax number.   Received fax from [facility]'s [facility] requiring signature from Dr. Milinda Cave and approval for patient to participate in study.  Form placed in McGowen inbox (front office area) 05/05/20 9:12AM

## 2020-05-06 NOTE — Telephone Encounter (Signed)
Form faxed to [phone] as requested by patient. Fax confirmation received.

## 2020-05-07 LAB — BASIC METABOLIC PANEL: Glucose: 95

## 2020-05-07 LAB — HEMOGLOBIN A1C: Hemoglobin A1C: 6.3

## 2020-05-07 LAB — LIPID PANEL
Cholesterol: 179 (ref 0–200)
HDL: 59 (ref 35–70)
LDL Cholesterol: 103
Triglycerides: 95 (ref 40–160)

## 2020-05-11 ENCOUNTER — Telehealth: Payer: Self-pay | Admitting: *Deleted

## 2020-05-11 NOTE — Telephone Encounter (Signed)
GI records received from [facility] on Dr Sharol Given desk for review

## 2020-05-28 ENCOUNTER — Encounter: Payer: Self-pay | Admitting: Family Medicine

## 2020-07-01 DIAGNOSIS — Z789 Other specified health status: Secondary | ICD-10-CM | POA: Diagnosis not present

## 2020-07-01 DIAGNOSIS — S76311A Strain of muscle, fascia and tendon of the posterior muscle group at thigh level, right thigh, initial encounter: Secondary | ICD-10-CM | POA: Diagnosis not present

## 2020-07-01 DIAGNOSIS — M25561 Pain in right knee: Secondary | ICD-10-CM | POA: Diagnosis not present

## 2020-07-01 DIAGNOSIS — Z7409 Other reduced mobility: Secondary | ICD-10-CM | POA: Diagnosis not present

## 2020-07-10 DIAGNOSIS — Z7409 Other reduced mobility: Secondary | ICD-10-CM | POA: Diagnosis not present

## 2020-07-10 DIAGNOSIS — Z789 Other specified health status: Secondary | ICD-10-CM | POA: Diagnosis not present

## 2020-07-10 DIAGNOSIS — S76311A Strain of muscle, fascia and tendon of the posterior muscle group at thigh level, right thigh, initial encounter: Secondary | ICD-10-CM | POA: Diagnosis not present

## 2020-07-10 DIAGNOSIS — M25561 Pain in right knee: Secondary | ICD-10-CM | POA: Diagnosis not present

## 2020-07-17 DIAGNOSIS — M9903 Segmental and somatic dysfunction of lumbar region: Secondary | ICD-10-CM | POA: Diagnosis not present

## 2020-07-17 DIAGNOSIS — M5417 Radiculopathy, lumbosacral region: Secondary | ICD-10-CM | POA: Diagnosis not present

## 2020-07-17 DIAGNOSIS — M9905 Segmental and somatic dysfunction of pelvic region: Secondary | ICD-10-CM | POA: Diagnosis not present

## 2020-07-17 DIAGNOSIS — M9902 Segmental and somatic dysfunction of thoracic region: Secondary | ICD-10-CM | POA: Diagnosis not present

## 2020-07-23 DIAGNOSIS — Z7409 Other reduced mobility: Secondary | ICD-10-CM | POA: Diagnosis not present

## 2020-07-23 DIAGNOSIS — S76311A Strain of muscle, fascia and tendon of the posterior muscle group at thigh level, right thigh, initial encounter: Secondary | ICD-10-CM | POA: Diagnosis not present

## 2020-07-23 DIAGNOSIS — Z789 Other specified health status: Secondary | ICD-10-CM | POA: Diagnosis not present

## 2020-07-23 DIAGNOSIS — M25561 Pain in right knee: Secondary | ICD-10-CM | POA: Diagnosis not present

## 2020-09-17 DIAGNOSIS — M25561 Pain in right knee: Secondary | ICD-10-CM | POA: Diagnosis not present

## 2020-09-17 DIAGNOSIS — M1711 Unilateral primary osteoarthritis, right knee: Secondary | ICD-10-CM | POA: Diagnosis not present

## 2020-10-05 DIAGNOSIS — M25561 Pain in right knee: Secondary | ICD-10-CM | POA: Diagnosis not present

## 2020-10-05 DIAGNOSIS — M1711 Unilateral primary osteoarthritis, right knee: Secondary | ICD-10-CM | POA: Diagnosis not present

## 2020-10-05 DIAGNOSIS — H401122 Primary open-angle glaucoma, left eye, moderate stage: Secondary | ICD-10-CM | POA: Diagnosis not present

## 2020-10-05 DIAGNOSIS — H401111 Primary open-angle glaucoma, right eye, mild stage: Secondary | ICD-10-CM | POA: Diagnosis not present

## 2020-10-12 DIAGNOSIS — Z789 Other specified health status: Secondary | ICD-10-CM | POA: Diagnosis not present

## 2020-10-12 DIAGNOSIS — M25561 Pain in right knee: Secondary | ICD-10-CM | POA: Diagnosis not present

## 2020-10-12 DIAGNOSIS — S76311A Strain of muscle, fascia and tendon of the posterior muscle group at thigh level, right thigh, initial encounter: Secondary | ICD-10-CM | POA: Diagnosis not present

## 2020-10-12 DIAGNOSIS — Z7409 Other reduced mobility: Secondary | ICD-10-CM | POA: Diagnosis not present

## 2020-10-15 DIAGNOSIS — M25561 Pain in right knee: Secondary | ICD-10-CM | POA: Diagnosis not present

## 2020-10-15 DIAGNOSIS — S76311A Strain of muscle, fascia and tendon of the posterior muscle group at thigh level, right thigh, initial encounter: Secondary | ICD-10-CM | POA: Diagnosis not present

## 2020-10-15 DIAGNOSIS — Z789 Other specified health status: Secondary | ICD-10-CM | POA: Diagnosis not present

## 2020-10-15 DIAGNOSIS — Z7409 Other reduced mobility: Secondary | ICD-10-CM | POA: Diagnosis not present

## 2020-12-10 DIAGNOSIS — N3941 Urge incontinence: Secondary | ICD-10-CM | POA: Diagnosis not present

## 2020-12-10 DIAGNOSIS — R351 Nocturia: Secondary | ICD-10-CM | POA: Diagnosis not present

## 2020-12-10 DIAGNOSIS — R350 Frequency of micturition: Secondary | ICD-10-CM | POA: Diagnosis not present

## 2021-01-25 DIAGNOSIS — H401131 Primary open-angle glaucoma, bilateral, mild stage: Secondary | ICD-10-CM | POA: Diagnosis not present

## 2021-01-25 DIAGNOSIS — H2511 Age-related nuclear cataract, right eye: Secondary | ICD-10-CM | POA: Diagnosis not present

## 2021-02-13 DIAGNOSIS — J101 Influenza due to other identified influenza virus with other respiratory manifestations: Secondary | ICD-10-CM | POA: Diagnosis not present

## 2021-03-09 ENCOUNTER — Telehealth: Payer: Self-pay

## 2021-03-09 NOTE — Telephone Encounter (Signed)
Patient is requesting transfer from [facility] to [place].  States she has moved from [place] and would like to come to the [facility] office for care.  Please advise.

## 2021-03-10 NOTE — Telephone Encounter (Signed)
Yes, okay.

## 2021-04-05 ENCOUNTER — Other Ambulatory Visit: Payer: Self-pay

## 2021-04-05 ENCOUNTER — Ambulatory Visit: Payer: BC Managed Care – PPO | Admitting: Family Medicine

## 2021-04-05 ENCOUNTER — Encounter: Payer: Self-pay | Admitting: Family Medicine

## 2021-04-05 VITALS — BP 134/78 | HR 91 | Temp 97.9°F | Resp 16 | Ht 64.25 in

## 2021-04-05 DIAGNOSIS — Z13820 Encounter for screening for osteoporosis: Secondary | ICD-10-CM | POA: Diagnosis not present

## 2021-04-05 DIAGNOSIS — Z0000 Encounter for general adult medical examination without abnormal findings: Secondary | ICD-10-CM | POA: Diagnosis not present

## 2021-04-05 DIAGNOSIS — Z1231 Encounter for screening mammogram for malignant neoplasm of breast: Secondary | ICD-10-CM | POA: Diagnosis not present

## 2021-04-05 NOTE — Patient Instructions (Signed)
Welcome to [facility] at [place]! It was a pleasure meeting you today. ° °As discussed, Please schedule a 12 month follow up visit today. ° °PLEASE NOTE: ° °If you had any LAB tests please let us know if you have not heard back within a few days. You may see your results on MyChart before we have a chance to review them but we will give you a call once they are reviewed by us. If we ordered any REFERRALS today, please let us know if you have not heard from their office within the next week.  °Let us know through MyChart if you are needing REFILLS, or have your pharmacy send us the request. You can also use MyChart to communicate with me or any office staff. ° °Please try these tips to maintain a healthy lifestyle: ° °Eat most of your calories during the day when you are active. Eliminate processed foods including packaged sweets (pies, cakes, cookies), reduce intake of potatoes, white bread, white pasta, and white rice. Look for whole grain options, oat flour or almond flour. ° °Each meal should contain half fruits/vegetables, one quarter protein, and one quarter carbs (no bigger than a computer mouse). ° °Cut down on sweet beverages. This includes juice, soda, and sweet tea. Also watch fruit intake, though this is a healthier sweet option, it still contains natural sugar! Limit to 3 servings daily. ° °Drink at least 1 glass of water with each meal and aim for at least 8 glasses per day ° °Exercise at least 150 minutes every week.   °

## 2021-04-05 NOTE — Progress Notes (Signed)
Phone [phone]   Subjective:   Patient is a 66 y.o. female presenting for annual physical.    Chief Complaint  Patient presents with   Follow-up  TOC from [facility] Glucose impaired-was in a study so A1C in Sept-6.2.  FH DM.  Working on [employer].  Some inc urination but taking supplements.  Glaucoma-opt in Dec-Dr. Waunita Schooner  Exercises 3 days/wk-weights-has trainer.. Occ cardio  No mamm since covid.  Colon 09/09/16-10 yrs  Bp's usu 134/79  Td 2020-[place]. - declines shingrix-didn't have chicken pox  See problem oriented charting- ROS- full  review of systems was completed and negative except for: knees pain-OA  The following were reviewed and entered/updated in epic: Past Medical History:  Diagnosis Date   Diabetes mellitus without complication ([place]) [id number]   hx well controlled. A1c 05/2020 6.3% with [facility] Alzheimer's prevention trial data/labs   Glaucoma 11-13   Hypercholesterolemia 01/2019   recommended statin 01/2019   Obesity, Class III, BMI 40-49.9 (morbid obesity) ([place])    Going to [employer] [facility] to do their wt loss program as of 02/2013   Tinnitus 06/14/2011   transient   Patient Active Problem List   Diagnosis Date Noted   Primary open-angle glaucoma 10/23/2015   Left carpal tunnel syndrome 09/17/2015   Hypertriglyceridemia 02/23/2015   Pre-diabetes 02/23/2015   Osteoarthritis of finger of right hand 06/12/2014   Insomnia 05/02/2014   Other fatigue 05/02/2014   Morbid obesity with BMI of 40.0-44.9, adult ([place]) 07/24/2013   Impaired glucose tolerance 01/21/2013   Glaucoma    Preventative health care 06/14/2011   Past Surgical History:  Procedure Laterality Date   ABDOMINAL HYSTERECTOMY  1996   partial- still has ovaries, for fibroids, heavy bleeding   COLONOSCOPY  approx 2009   in [place]: pt reports that it was NORMAL   COLONOSCOPY  08/2016   NORMAL per pt   KNEE ARTHROSCOPY Left 1989   left, arthroscopy to clean up meniscus    Family History   Problem Relation Age of Onset   Diabetes Mother 60       type 2   Heart disease Mother        pacemaker   Obesity Mother    Hypertension Mother    Uterine cancer Mother    Stomach cancer Father 55       drinker   Alcohol abuse Brother    Diabetes Maternal Grandmother        type 2   Heart disease Maternal Grandmother        pacer   Diabetes Brother 88   Ovarian cancer Maternal Aunt    Heart attack Neg Hx    Hyperlipidemia Neg Hx    Sudden death Neg Hx     Medications- reviewed and updated Current Outpatient Medications  Medication Sig Dispense Refill   latanoprost (XALATAN) 0.005 % ophthalmic solution Place 1 drop into both eyes nightly.     No current facility-administered medications for this visit.    Allergies-reviewed and updated No Known Allergies  Social History   Social History Narrative   Married, has 2 children (one adopted and one step).   Occupation: IT   No tobacco.  No alcohol/drugs.   Tries to "be good" and eat a heart healthy diet.   Objective  Objective:  BP 134/78 (BP Location: Right Arm, Patient Position: Sitting, Cuff Size: Normal)    Pulse 91    Temp 97.9 F (36.6 C)    Resp 16    Ht  5' 4.25" (1.632 m)    SpO2 98%    BMI 47.26 kg/m  Gen: WDWN NAD MOAAF HEENT: NCAT, conjunctiva not injected, sclera nonicteric. TM WNL B. OP moist NECK:  supple, no thyromegaly, no nodes, no carotid bruits CARDIAC: RRR, S1S2+, no murmur. DP 2+B LUNGS: CTAB. No wheezes ABDOMEN:  BS+, soft, NTND, No HSM, no masses EXT:  no edema MSK: no gross abnormalities. MS 5/5 all 4 NEURO: A&O x3.  CN II-XII intact.  PSYCH: normal mood. Good eye contact    Assessment and Plan   Health Maintenance counseling: 1. Anticipatory guidance: Patient counseled regarding regular dental exams q6 months, eye exams,  avoiding smoking and second hand smoke, limiting alcohol to 1 beverage per day, no illicit drugs.   2. Risk factor reduction:  Advised patient of need for regular  exercise and diet rich and fruits and vegetables to reduce risk of heart attack and stroke. Exercise- increase.  Wt Readings from Last 3 Encounters:  10/16/19 277 lb 8 oz (125.9 kg)  04/27/18 250 lb (113.4 kg)  11/27/17 282 lb 4 oz (128 kg)   3. Immunizations/screenings/ancillary studies Immunization History  Administered Date(s) Administered   Fluad Quad(high Dose 65+) 01/08/2019   Influenza,inj,Quad PF,6+ Mos 01/23/2013, 12/06/2013, 01/04/2015   Influenza-Unspecified 01/23/2013, 12/06/2013, 01/04/2015, 01/06/2021   PFIZER(Purple Top)SARS-COV-2 Vaccination 05/04/2019, 05/25/2019   Pneumococcal Conjugate-13 01/06/2021   Pneumococcal Polysaccharide-23 01/08/2019   Td 03/07/2010   Typhoid Inactivated 03/16/2016   Health Maintenance Due  Topic Date Due   OPHTHALMOLOGY EXAM  Never done   Hepatitis C Screening  Never done   MAMMOGRAM  08/06/2018   COVID-19 Vaccine (3 - Booster for Pfizer series) 07/20/2019   FOOT EXAM  01/08/2020   URINE MICROALBUMIN  01/08/2020   TETANUS/TDAP  03/07/2020   HEMOGLOBIN A1C  11/07/2020    4. Cervical cancer screening- n/z hyst 5. Breast cancer screening-  mammogram sch 6. Colon cancer screening - due 2028 7. Skin cancer screening- advised regular sunscreen use. Denies worrisome, changing, or new skin lesions.  8. Birth control/STD check- n/a.  hyst 9. Osteoporosis screening- sch 10. Smoking associated screening - never smoker  Problem List Items Addressed This Visit   None Visit Diagnoses     Wellness examination    -  Primary   Relevant Orders   Hemoglobin A1c   TSH   Lipid panel   Hepatitis C antibody   Comprehensive metabolic panel   CBC with Differential/Platelet   Screening mammogram for breast cancer       Relevant Orders   MM Digital Screening   Screening for osteoporosis       Relevant Orders   DG DXA BODY COMPOSITION   Obesity, Class III, BMI 40-49.9 (morbid obesity) ([place])   (Chronic)        Wellness-antic guidance.  Check  labs Glucose impaired-discussed once, A1C 6.6 which is DM.  Only once.  Pt working on [employer] check labs, A1C and if impaired, delete DM Dx..   Screening-sch mamm/DXA.  Colon UTD.  Will bring copy immunization records.  Declines shingrix Morbid obesity-pt working on TLC 3x/wk-advised to inc to 5 days/wk  Recommended follow up: 12Return in about 1 year (around 04/05/2022) for annual. No future appointments.  Lab/Order associations:semi fasting   ICD-10-CM   1. Wellness examination  Z00.00 Hemoglobin A1c    TSH    Lipid panel    Hepatitis C antibody    Comprehensive metabolic panel    CBC with Differential/Platelet  2. Screening mammogram for breast cancer  Z12.31 MM Digital Screening    3. Screening for osteoporosis  Z13.820 DG DXA BODY COMPOSITION    4. Obesity, Class III, BMI 40-49.9 (morbid obesity) (HCC) Chronic E66.01       No orders of the defined types were placed in this encounter.   Wellington Hampshire, MD

## 2021-04-06 ENCOUNTER — Other Ambulatory Visit: Payer: Self-pay | Admitting: Family Medicine

## 2021-04-06 DIAGNOSIS — Z13820 Encounter for screening for osteoporosis: Secondary | ICD-10-CM

## 2021-04-06 LAB — CBC WITH DIFFERENTIAL/PLATELET
Basophils Absolute: 0.1 10*3/uL (ref 0.0–0.1)
Basophils Relative: 1.2 % (ref 0.0–3.0)
Eosinophils Absolute: 0.2 10*3/uL (ref 0.0–0.7)
Eosinophils Relative: 2.3 % (ref 0.0–5.0)
HCT: 41.5 % (ref 36.0–46.0)
Hemoglobin: 13.2 g/dL (ref 12.0–15.0)
Lymphocytes Relative: 42.6 % (ref 12.0–46.0)
Lymphs Abs: 3 10*3/uL (ref 0.7–4.0)
MCHC: 31.7 g/dL (ref 30.0–36.0)
MCV: 90.4 fl (ref 78.0–100.0)
Monocytes Absolute: 0.6 10*3/uL (ref 0.1–1.0)
Monocytes Relative: 7.9 % (ref 3.0–12.0)
Neutro Abs: 3.2 10*3/uL (ref 1.4–7.7)
Neutrophils Relative %: 46 % (ref 43.0–77.0)
Platelets: 235 10*3/uL (ref 150.0–400.0)
RBC: 4.59 Mil/uL (ref 3.87–5.11)
RDW: 15 % (ref 11.5–15.5)
WBC: 7 10*3/uL (ref 4.0–10.5)

## 2021-04-06 LAB — LDL CHOLESTEROL, DIRECT: Direct LDL: 94 mg/dL

## 2021-04-06 LAB — COMPREHENSIVE METABOLIC PANEL
ALT: 14 U/L (ref 0–35)
AST: 17 U/L (ref 0–37)
Albumin: 4.3 g/dL (ref 3.5–5.2)
Alkaline Phosphatase: 74 U/L (ref 39–117)
BUN: 14 mg/dL (ref 6–23)
CO2: 29 mEq/L (ref 19–32)
Calcium: 9.6 mg/dL (ref 8.4–10.5)
Chloride: 102 mEq/L (ref 96–112)
Creatinine, Ser: 0.85 mg/dL (ref 0.40–1.20)
GFR: 70.74 mL/min (ref >60.00)
Glucose, Bld: 88 mg/dL (ref 70–99)
Potassium: 3.8 mEq/L (ref 3.5–5.1)
Sodium: 138 mEq/L (ref 135–145)
Total Bilirubin: 0.3 mg/dL (ref 0.2–1.2)
Total Protein: 8 g/dL (ref 6.0–8.3)

## 2021-04-06 LAB — LIPID PANEL
Cholesterol: 175 mg/dL (ref 0–200)
HDL: 54.7 mg/dL (ref >39.00)
NonHDL: 120.57
Total CHOL/HDL Ratio: 3
Triglycerides: 236 mg/dL — ABNORMAL HIGH (ref 0.0–149.0)
VLDL: 47.2 mg/dL — ABNORMAL HIGH (ref 0.0–40.0)

## 2021-04-06 LAB — HEPATITIS C ANTIBODY
Hepatitis C Ab: NONREACTIVE
SIGNAL TO CUT-OFF: 0.12 (ref <1.00)

## 2021-04-06 LAB — TSH: TSH: 1.57 u[IU]/mL (ref 0.35–5.50)

## 2021-04-06 LAB — HEMOGLOBIN A1C: Hgb A1c MFr Bld: 6.5 % (ref 4.6–6.5)

## 2021-04-07 ENCOUNTER — Other Ambulatory Visit: Payer: Self-pay | Admitting: Family Medicine

## 2021-04-07 DIAGNOSIS — Z1231 Encounter for screening mammogram for malignant neoplasm of breast: Secondary | ICD-10-CM

## 2021-04-16 ENCOUNTER — Ambulatory Visit
Admission: RE | Admit: 2021-04-16 | Discharge: 2021-04-16 | Disposition: A | Discharge status: Home / Self Care | Payer: BC Managed Care – PPO | Source: Ambulatory Visit

## 2021-04-16 DIAGNOSIS — Z1231 Encounter for screening mammogram for malignant neoplasm of breast: Secondary | ICD-10-CM | POA: Diagnosis not present

## 2021-08-12 ENCOUNTER — Other Ambulatory Visit: Payer: Self-pay | Admitting: Family Medicine

## 2021-08-12 ENCOUNTER — Ambulatory Visit: Payer: BC Managed Care – PPO | Admitting: Family Medicine

## 2021-08-12 ENCOUNTER — Encounter: Payer: Self-pay | Admitting: Family Medicine

## 2021-08-12 VITALS — BP 121/69 | HR 82 | Temp 97.5°F | Ht 65.0 in | Wt 284.2 lb

## 2021-08-12 DIAGNOSIS — E119 Type 2 diabetes mellitus without complications: Secondary | ICD-10-CM

## 2021-08-12 DIAGNOSIS — L03011 Cellulitis of right finger: Secondary | ICD-10-CM

## 2021-08-12 DIAGNOSIS — Z6841 Body Mass Index (BMI) 40.0 and over, adult: Secondary | ICD-10-CM

## 2021-08-12 LAB — COMPREHENSIVE METABOLIC PANEL
ALT: 17 U/L (ref 0–35)
AST: 16 U/L (ref 0–37)
Albumin: 4.1 g/dL (ref 3.5–5.2)
Alkaline Phosphatase: 68 U/L (ref 39–117)
BUN: 17 mg/dL (ref 6–23)
CO2: 23 mEq/L (ref 19–32)
Calcium: 9.7 mg/dL (ref 8.4–10.5)
Chloride: 106 mEq/L (ref 96–112)
Creatinine, Ser: 0.9 mg/dL (ref 0.40–1.20)
GFR: 65.89 mL/min (ref >60.00)
Glucose, Bld: 89 mg/dL (ref 70–99)
Potassium: 4 mEq/L (ref 3.5–5.1)
Sodium: 139 mEq/L (ref 135–145)
Total Bilirubin: 0.3 mg/dL (ref 0.2–1.2)
Total Protein: 7.5 g/dL (ref 6.0–8.3)

## 2021-08-12 LAB — MICROALBUMIN / CREATININE URINE RATIO
Creatinine,U: 128.9 mg/dL
Microalb Creat Ratio: 0.5 mg/g (ref 0.0–30.0)
Microalb, Ur: <0.7 mg/dL (ref 0.0–1.9)

## 2021-08-12 LAB — HEMOGLOBIN A1C: Hgb A1c MFr Bld: 6.5 % (ref 4.6–6.5)

## 2021-08-12 MED ORDER — TRULICITY 0.75 MG/0.5ML ~~LOC~~ SOAJ: 0.7500 mg | SUBCUTANEOUS | 0 refills | Status: DC

## 2021-08-12 MED ORDER — CEPHALEXIN 500 MG PO CAPS: 500.0000 mg | ORAL_CAPSULE | Freq: Two times a day (BID) | ORAL | 0 refills | Status: AC

## 2021-08-12 MED ORDER — TIRZEPATIDE 2.5 MG/0.5ML ~~LOC~~ SOAJ: 2.5000 mg | SUBCUTANEOUS | 0 refills | Status: DC

## 2021-08-12 NOTE — Addendum Note (Signed)
Addended by: Wellington Hampshire on: 08/12/2021 02:33 PM   Modules accepted: Orders

## 2021-08-12 NOTE — Patient Instructions (Signed)
It was very nice to see you today!  Let me know if finger getting worse    PLEASE NOTE:  If you had any lab tests please let us know if you have not heard back within a few days. You may see your results on MyChart before we have a chance to review them but we will give you a call once they are reviewed by [place]. If we ordered any referrals today, please let us know if you have not heard from their office within the next week.   Please try these tips to maintain a healthy lifestyle:  Eat most of your calories during the day when you are active. Eliminate processed foods including packaged sweets (pies, cakes, cookies), reduce intake of potatoes, white bread, white pasta, and white rice. Look for whole grain options, oat flour or almond flour.  Each meal should contain half fruits/vegetables, one quarter protein, and one quarter carbs (no bigger than a computer mouse).  Cut down on sweet beverages. This includes juice, soda, and sweet tea. Also watch fruit intake, though this is a healthier sweet option, it still contains natural sugar! Limit to 3 servings daily.  Drink at least 1 glass of water with each meal and aim for at least 8 glasses per day  Exercise at least 150 minutes every week.

## 2021-08-12 NOTE — Progress Notes (Signed)
Subjective:     Patient ID: Sheri Liu, female    DOB: 05-28-1955, 66 y.o.   MRN: [id number]  Chief Complaint  Patient presents with   Diabetes    HPI  Predm/DM-A1C 6.5 on 04/05/21. Had/has been working on TLC.   Has a trainer.  Vision a little blurry.  Nail infection R index.  Wt at home goes up and down the same 6#. No inc thirst, no inc urination.  Would like monjaro-no fh thyroid ca Incontinence-sees urol.  Morbid obesity-needs to lose wt and trying everything.    Health Maintenance Due  Topic Date Due   OPHTHALMOLOGY EXAM  Never done   COVID-19 Vaccine (3 - Pfizer series) 07/20/2019   FOOT EXAM  01/08/2020   URINE MICROALBUMIN  01/08/2020    Past Medical History:  Diagnosis Date   Diabetes mellitus without complication (HCC) 01/21/2013   hx well controlled. A1c 05/2020 6.3% with [facility] Alzheimer's prevention trial data/labs   Glaucoma 11-13   Hypercholesterolemia 01/2019   recommended statin 01/2019   Obesity, Class III, BMI 40-49.9 (morbid obesity) ([facility])    Going to [facility] to do their wt loss program as of 02/2013   Tinnitus 06/14/2011   transient    Past Surgical History:  Procedure Laterality Date   ABDOMINAL HYSTERECTOMY  1996   partial- still has ovaries, for fibroids, heavy bleeding   COLONOSCOPY  approx 2009   in [place]: pt reports that it was NORMAL   COLONOSCOPY  08/2016   NORMAL per pt   KNEE ARTHROSCOPY Left 1989   left, arthroscopy to clean up meniscus    Outpatient Medications Prior to Visit  Medication Sig Dispense Refill   latanoprost (XALATAN) 0.005 % ophthalmic solution Place 1 drop into both eyes nightly.     No facility-administered medications prior to visit.    No Known Allergies ROS neg/noncontributory except as noted HPI/below Occ flutter in chest-not when active.  More when nervous. Takes supplements. Taking Berbidine and d and sucraline and coQ10 and Bvits and tumeric.  CBD for eyes.       Objective:     BP  121/69   Pulse 82   Temp (!) 97.5 F (36.4 C) (Temporal)   Ht 5\' 5"  (1.651 m)   Wt 284 lb 3.2 oz (128.9 kg)   SpO2 98%   BMI 47.29 kg/m  Wt Readings from Last 3 Encounters:  08/12/21 284 lb 3.2 oz (128.9 kg)  10/16/19 277 lb 8 oz (125.9 kg)  04/27/18 250 lb (113.4 kg)    Physical Exam   Gen: WDWN NAD HEENT: NCAT, conjunctiva not injected, sclera nonicteric NECK:  supple, no thyromegaly, no nodes, no carotid bruits CARDIAC: RRR, S1S2+, no murmur. DP 2+B LUNGS: CTAB. No wheezes ABDOMEN:  BS+, soft, NTND, No HSM, no masses EXT:  no edema MSK: no gross abnormalities.  NEURO: A&O x3.  CN II-XII intact.  PSYCH: normal mood. Good eye contact R index finger-base of nail-some slight fluctuance     Assessment & Plan:   Problem List Items Addressed This Visit       Other   Morbid obesity with BMI of 40.0-44.9, adult (HCC)   Relevant Medications   tirzepatide (MOUNJARO) 2.5 MG/0.5ML Pen   Other Visit Diagnoses     Type 2 diabetes mellitus without complication, without long-term current use of insulin (HCC)    -  Primary   Relevant Medications   tirzepatide (MOUNJARO) 2.5 MG/0.5ML Pen   Other Relevant  Orders   Comprehensive metabolic panel   Hemoglobin A1c   Microalbumin / creatinine urine ratio   Paronychia of right index finger       Relevant Medications   cephALEXin (KEFLEX) 500 MG capsule      DM type 2-seeing opt in 2 wks.  Notify him DM.  Will cont TLC.  Check CMP,A1C, urine microalb/creat ratio.  Start mounjaro 2.5mg  weekly x 4 then titrate up. SED-needs to lose wt.  F/u 3 mo Morbid obesity-cont TLC.  Start Mounjaro Paronychia R index finger-stop getting nails done.  Keflex 500mg  bid  Meds ordered this encounter  Medications   tirzepatide (MOUNJARO) 2.5 MG/0.5ML Pen    Sig: Inject 2.5 mg into the skin once a week.    Dispense:  2 mL    Refill:  0   cephALEXin (KEFLEX) 500 MG capsule    Sig: Take 1 capsule (500 mg total) by mouth 2 (two) times daily for 7  days. Take for 7 days    Dispense:  14 capsule    Refill:  0    , MD

## 2021-08-13 ENCOUNTER — Telehealth: Payer: Self-pay | Admitting: Family Medicine

## 2021-08-13 ENCOUNTER — Other Ambulatory Visit: Payer: Self-pay | Admitting: *Deleted

## 2021-08-13 MED ORDER — TIRZEPATIDE 2.5 MG/0.5ML ~~LOC~~ SOAJ: 2.5000 mg | SUBCUTANEOUS | 0 refills | Status: DC

## 2021-08-13 NOTE — Telephone Encounter (Signed)
Patient stated that during her visit that it was discussed that she was going to be prescribed tirzepatide [facility]) 2.5 MG/0.5ML Pen, when she got to the pharmacy Trulicity was sent. Patient stated that she want Mounjaro sent to the pharmacy does not want to take Trulicity and would like a PA done as well if needed.

## 2021-08-13 NOTE — Telephone Encounter (Signed)
Rx sent to the pharmacy. PA will be completed if pharmacy send fax stating that one is needed.

## 2021-08-13 NOTE — Telephone Encounter (Signed)
Pt called in regards to needing a prior authroization for [facility]. States she was instructed during last visit to call and talk with Dr. Myrtie Cruise nurse regarding this issue. Pt may be contacted at [phone].

## 2021-08-16 ENCOUNTER — Telehealth: Payer: Self-pay | Admitting: *Deleted

## 2021-08-16 NOTE — Telephone Encounter (Signed)
Pt called back after receiving VM regarding her insurance covering mounjaro. I read her the message below verbatim and also she verbalized her understanding, she states she was informed that she was diabetic. Due to these questions, she would like a callback at [phone].

## 2021-08-16 NOTE — Telephone Encounter (Signed)
Left detailed message informing patient that I spoke with insurance company concerning mounjaro and due to her not having a dx of diabetes, insurance will not cover.

## 2021-08-18 NOTE — Telephone Encounter (Signed)
Spoke with patient and she confirmed that she was diabetic. [employer], they will fax over PA form that need to be completed and faxed back.

## 2021-08-18 NOTE — Telephone Encounter (Signed)
Pt calling back about questions.  Pt will be available for the rest of the day.  Primary phone (cell phone) is best call back number.   FO Rep apologized for clerical error. Pt expressed understanding.

## 2021-08-19 MED ORDER — SEMAGLUTIDE(0.25 OR 0.5MG/DOS) 2 MG/3ML ~~LOC~~ SOPN: PEN_INJECTOR | SUBCUTANEOUS | 1 refills | Status: AC

## 2021-08-19 NOTE — Telephone Encounter (Signed)
Called patient and informed her that insurance company sent over PA form for [facility] and one of the requirements is that she has tried 3 or more of the following medications: Ozempic, Rybelus, Trulicity, or Victoza. Patient has agreed to try Ozempic.

## 2021-08-19 NOTE — Telephone Encounter (Signed)
Form placed on provider's desk for review and signature, will fax back when completed.

## 2021-08-19 NOTE — Addendum Note (Signed)
Addended by: Angelena Sole on: 08/19/2021 02:09 PM   Modules accepted: Orders

## 2021-08-27 ENCOUNTER — Ambulatory Visit
Admission: RE | Admit: 2021-08-27 | Discharge: 2021-08-27 | Disposition: A | Discharge status: Home / Self Care | Payer: BC Managed Care – PPO | Source: Ambulatory Visit | Attending: Family Medicine | Admitting: Family Medicine

## 2021-08-27 DIAGNOSIS — Z13820 Encounter for screening for osteoporosis: Secondary | ICD-10-CM

## 2021-08-27 DIAGNOSIS — Z780 Asymptomatic menopausal state: Secondary | ICD-10-CM | POA: Diagnosis not present

## 2021-08-27 LAB — HM DEXA SCAN: HM Dexa Scan: NORMAL

## 2021-08-30 ENCOUNTER — Encounter: Payer: Self-pay | Admitting: Family Medicine

## 2021-08-30 DIAGNOSIS — H2513 Age-related nuclear cataract, bilateral: Secondary | ICD-10-CM | POA: Diagnosis not present

## 2021-08-30 DIAGNOSIS — H402211 Chronic angle-closure glaucoma, right eye, mild stage: Secondary | ICD-10-CM | POA: Diagnosis not present

## 2021-08-30 DIAGNOSIS — H402223 Chronic angle-closure glaucoma, left eye, severe stage: Secondary | ICD-10-CM | POA: Diagnosis not present

## 2021-11-15 ENCOUNTER — Encounter: Payer: Self-pay | Admitting: Family Medicine

## 2021-11-15 ENCOUNTER — Ambulatory Visit: Payer: BC Managed Care – PPO | Admitting: Family Medicine

## 2021-11-15 VITALS — BP 138/70 | HR 86 | Temp 97.9°F | Ht 65.0 in | Wt 276.2 lb

## 2021-11-15 DIAGNOSIS — E119 Type 2 diabetes mellitus without complications: Secondary | ICD-10-CM | POA: Diagnosis not present

## 2021-11-15 DIAGNOSIS — Z6841 Body Mass Index (BMI) 40.0 and over, adult: Secondary | ICD-10-CM

## 2021-11-15 NOTE — Patient Instructions (Signed)
It was very nice to see you today!  Keep up good work.  Let me know on ozempic   PLEASE NOTE:  If you had any lab tests please let us know if you have not heard back within a few days. You may see your results on MyChart before we have a chance to review them but we will give you a call once they are reviewed by [place]. If we ordered any referrals today, please let us know if you have not heard from their office within the next week.   Please try these tips to maintain a healthy lifestyle:  Eat most of your calories during the day when you are active. Eliminate processed foods including packaged sweets (pies, cakes, cookies), reduce intake of potatoes, white bread, white pasta, and white rice. Look for whole grain options, oat flour or almond flour.  Each meal should contain half fruits/vegetables, one quarter protein, and one quarter carbs (no bigger than a computer mouse).  Cut down on sweet beverages. This includes juice, soda, and sweet tea. Also watch fruit intake, though this is a healthier sweet option, it still contains natural sugar! Limit to 3 servings daily.  Drink at least 1 glass of water with each meal and aim for at least 8 glasses per day  Exercise at least 150 minutes every week.

## 2021-11-15 NOTE — Progress Notes (Signed)
Subjective:     Patient ID: Sheri Liu, female    DOB: Oct 31, 1955, 66 y.o.   MRN: [id number]  Chief Complaint  Patient presents with   Follow-up    3 month follow-up for weight, haven't started medication     HPI  Wt-hasn't started meds-Mounjaro not covered.  Concerned about SE of meds-GLP.  Walking in water at [employer], has a Psychologist, educational.  2.  DM 2-working on TLC.  Got ozempic but afraid to take d/t article about "stomach blockage". Feeling "foggy" at times.   Health Maintenance Due  Topic Date Due   OPHTHALMOLOGY EXAM  Never done   FOOT EXAM  01/08/2020    Past Medical History:  Diagnosis Date   Diabetes mellitus without complication (HCC) 01/21/2013   hx well controlled. A1c 05/2020 6.3% with [facility] Alzheimer's prevention trial data/labs   Glaucoma 11-13   Hypercholesterolemia 01/2019   recommended statin 01/2019   Obesity, Class III, BMI 40-49.9 (morbid obesity) (HCC)    Going to [facility] to do their wt loss program as of 02/2013   Tinnitus 06/14/2011   transient    Past Surgical History:  Procedure Laterality Date   ABDOMINAL HYSTERECTOMY  1996   partial- still has ovaries, for fibroids, heavy bleeding   COLONOSCOPY  approx 2009   in [place]: pt reports that it was NORMAL   COLONOSCOPY  08/2016   NORMAL per pt   KNEE ARTHROSCOPY Left 1989   left, arthroscopy to clean up meniscus    Outpatient Medications Prior to Visit  Medication Sig Dispense Refill   brimonidine (ALPHAGAN P) 0.1 % SOLN PLACE 1 DROP INTO BOTH EYES 3 TIMES DAILY.     dorzolamide-timolol (COSOPT) 22.3-6.8 MG/ML ophthalmic solution INSTILL 1 DROP INTO BOTH EYES TWICE A DAY**PLEASE CALL FOR APPOINTMENT**     latanoprost (XALATAN) 0.005 % ophthalmic solution Place 1 drop into both eyes nightly.     latanoprost (XALATAN) 0.005 % ophthalmic solution Place 1 drop into both eyes nightly.     No facility-administered medications prior to visit.    No Known Allergies ROS neg/noncontributory  except as noted HPI/below  No CAD in family.  Brother-in-law had stents and concerned about family d/t age.       Objective:     BP 138/70   Pulse 86   Temp 97.9 F (36.6 C) (Temporal)   Ht 5\' 5"  (1.651 m)   Wt 276 lb 4 oz (125.3 kg)   SpO2 96%   BMI 45.97 kg/m  Wt Readings from Last 3 Encounters:  11/15/21 276 lb 4 oz (125.3 kg)  08/12/21 284 lb 3.2 oz (128.9 kg)  10/16/19 277 lb 8 oz (125.9 kg)    Physical Exam   Gen: WDWN NAD HEENT: NCAT, conjunctiva not injected, sclera nonicteric NECK:  supple, no thyromegaly, no nodes, no carotid bruits CARDIAC: RRR, S1S2+, no murmur. DP 2+B LUNGS: CTAB. No wheezes EXT:  no edema MSK: no gross abnormalities.  NEURO: A&O x3.  CN II-XII intact.  PSYCH: normal mood. Good eye contact     Assessment & Plan:   Problem List Items Addressed This Visit       Endocrine   Type 2 diabetes mellitus without complication, without long-term current use of insulin (HCC) - Primary     Other   Morbid obesity with BMI of 40.0-44.9, adult (HCC)  1.  Type 2 diabetes without complications-chronic.  Well-controlled with diet exercise, however, struggling with weight.  Insurance does not cover  Mounjaro.  She does have Ozempic at home, but concerns with taking it.  We discussed concerns, risk, benefits.  She will continue to to consider but probably will start.  Advised to call monthly for refills and whether we are going to increase dose or stay at same dose.  Return in 3 months and will check labs 2.  Obesity-patient's insurance does not cover weight loss medications.  She is working on diet and exercise.  Considering taking the Ozempic.  See above discussion.  Follow-up in 3 months  No orders of the defined types were placed in this encounter.   Angelena Sole, MD

## 2022-03-03 ENCOUNTER — Other Ambulatory Visit: Payer: Self-pay | Admitting: Family Medicine

## 2022-03-03 DIAGNOSIS — Z1231 Encounter for screening mammogram for malignant neoplasm of breast: Secondary | ICD-10-CM

## 2022-03-09 ENCOUNTER — Ambulatory Visit: Payer: BC Managed Care – PPO | Admitting: Family Medicine

## 2022-03-11 ENCOUNTER — Ambulatory Visit: Payer: BC Managed Care – PPO | Admitting: Family Medicine

## 2022-03-11 ENCOUNTER — Encounter: Payer: Self-pay | Admitting: Family Medicine

## 2022-03-11 VITALS — BP 122/76 | HR 64 | Temp 97.7°F | Ht 65.0 in | Wt 254.5 lb

## 2022-03-11 DIAGNOSIS — R202 Paresthesia of skin: Secondary | ICD-10-CM | POA: Diagnosis not present

## 2022-03-11 DIAGNOSIS — E781 Pure hyperglyceridemia: Secondary | ICD-10-CM

## 2022-03-11 DIAGNOSIS — E119 Type 2 diabetes mellitus without complications: Secondary | ICD-10-CM | POA: Diagnosis not present

## 2022-03-11 DIAGNOSIS — Z79899 Other long term (current) drug therapy: Secondary | ICD-10-CM | POA: Diagnosis not present

## 2022-03-11 DIAGNOSIS — Z6841 Body Mass Index (BMI) 40.0 and over, adult: Secondary | ICD-10-CM

## 2022-03-11 MED ORDER — TIRZEPATIDE 2.5 MG/0.5ML ~~LOC~~ SOAJ: 2.5000 mg | SUBCUTANEOUS | 0 refills | Status: DC

## 2022-03-11 NOTE — Patient Instructions (Addendum)
It was very nice to see you today!  Keep up the great work!!!  Message monthly for dose change for mounjaro  Take B 12 vitamin   PLEASE NOTE:  If you had any lab tests please let us know if you have not heard back within a few days. You may see your results on MyChart before we have a chance to review them but we will give you a call once they are reviewed by [place]. If we ordered any referrals today, please let us know if you have not heard from their office within the next week.   Please try these tips to maintain a healthy lifestyle:  Eat most of your calories during the day when you are active. Eliminate processed foods including packaged sweets (pies, cakes, cookies), reduce intake of potatoes, white bread, white pasta, and white rice. Look for whole grain options, oat flour or almond flour.  Each meal should contain half fruits/vegetables, one quarter protein, and one quarter carbs (no bigger than a computer mouse).  Cut down on sweet beverages. This includes juice, soda, and sweet tea. Also watch fruit intake, though this is a healthier sweet option, it still contains natural sugar! Limit to 3 servings daily.  Drink at least 1 glass of water with each meal and aim for at least 8 glasses per day  Exercise at least 150 minutes every week.

## 2022-03-11 NOTE — Progress Notes (Unsigned)
Subjective:     Patient ID: Sheri Liu, female    DOB: 07-25-1955, 67 y.o.   MRN: [id number]  Chief Complaint  Patient presents with   Follow-up    Follow-up on A1C and medications     HPI  DM type 2-working on diet/exercise-has lost 22#!  Joined a food addicts program.  Hasn't started ozempic-concerned w/SE.  Exercising.  Wants to try mounjaro.  Great toe hurting some  Health Maintenance Due  Topic Date Due   OPHTHALMOLOGY EXAM  Never done   FOOT EXAM  01/08/2020   DTaP/Tdap/Td (2 - Tdap) 03/07/2020   HEMOGLOBIN A1C  02/11/2022    Past Medical History:  Diagnosis Date   Diabetes mellitus without complication ([place]) 35/00/9381   hx well controlled. A1c 05/2020 6.3% with [facility] Alzheimer's prevention trial data/labs   Glaucoma 11-13   Hypercholesterolemia 01/2019   recommended statin 01/2019   Obesity, Class III, BMI 40-49.9 (morbid obesity) ([place])    Going to [facility] to do their wt loss program as of 02/2013   Tinnitus 06/14/2011   transient    Past Surgical History:  Procedure Laterality Date   ABDOMINAL HYSTERECTOMY  1996   partial- still has ovaries, for fibroids, heavy bleeding   COLONOSCOPY  approx 2009   in [place]: pt reports that it was NORMAL   COLONOSCOPY  08/2016   NORMAL per pt   KNEE ARTHROSCOPY Left 1989   left, arthroscopy to clean up meniscus    Outpatient Medications Prior to Visit  Medication Sig Dispense Refill   brimonidine (ALPHAGAN P) 0.1 % SOLN PLACE 1 DROP INTO BOTH EYES 3 TIMES DAILY.     dorzolamide-timolol (COSOPT) 22.3-6.8 MG/ML ophthalmic solution INSTILL 1 DROP INTO BOTH EYES TWICE A DAY**PLEASE CALL FOR APPOINTMENT**     latanoprost (XALATAN) 0.005 % ophthalmic solution Place 1 drop into both eyes nightly.     No facility-administered medications prior to visit.    No Known Allergies ROS neg/noncontributory except as noted HPI/below      Objective:     BP 122/76   Pulse 64   Temp 97.7 F (36.5 C)  (Temporal)   Ht 5\' 5"  (1.651 m)   Wt 254 lb 8 oz (115.4 kg)   SpO2 99%   BMI 42.35 kg/m  Wt Readings from Last 3 Encounters:  03/11/22 254 lb 8 oz (115.4 kg)  11/15/21 276 lb 4 oz (125.3 kg)  08/12/21 284 lb 3.2 oz (128.9 kg)    Physical Exam   Gen: WDWN NAD HEENT: NCAT, conjunctiva not injected, sclera nonicteric NECK:  supple, no thyromegaly, no nodes, no carotid bruits CARDIAC: RRR, S1S2+, no murmur. DP 2+B LUNGS: CTAB. No wheezes ABDOMEN:  BS+, soft, NTND, No HSM, no masses EXT:  no edema MSK: no gross abnormalities.  NEURO: A&O x3.  CN II-XII intact.  PSYCH: normal mood. Good eye contact  Diabetic Foot Exam - Simple   Simple Foot Form Diabetic Foot exam was performed with the following findings: Yes 03/11/2022  3:04 PM  Visual Inspection No deformities, no ulcerations, no other skin breakdown bilaterally: Yes Sensation Testing See comments: Yes Pulse Check Posterior Tibialis and Dorsalis pulse intact bilaterally: Yes Comments Dec sens        Assessment & Plan:   Problem List Items Addressed This Visit       Endocrine   Type 2 diabetes mellitus without complication, without long-term current use of insulin ([place]) - Primary   Relevant Orders  Hemoglobin A1c   Comprehensive metabolic panel   Lipid panel     Other   Morbid obesity with BMI of 40.0-44.9, adult (HCC)   Hypertriglyceridemia   Relevant Orders   Comprehensive metabolic panel   Lipid panel    No orders of the defined types were placed in this encounter.   Wellington Hampshire, MD

## 2022-03-12 LAB — COMPREHENSIVE METABOLIC PANEL
AG Ratio: 1.5 (calc) (ref 1.0–2.5)
ALT: 18 U/L (ref 6–29)
AST: 14 U/L (ref 10–35)
Albumin: 4.4 g/dL (ref 3.6–5.1)
Alkaline phosphatase (APISO): 69 U/L (ref 37–153)
BUN: 16 mg/dL (ref 7–25)
CO2: 24 mmol/L (ref 20–32)
Calcium: 9.5 mg/dL (ref 8.6–10.4)
Chloride: 105 mmol/L (ref 98–110)
Creat: 0.86 mg/dL (ref 0.50–1.05)
Globulin: 3 g/dL (calc) (ref 1.9–3.7)
Glucose, Bld: 93 mg/dL (ref 65–99)
Potassium: 4.5 mmol/L (ref 3.5–5.3)
Sodium: 140 mmol/L (ref 135–146)
Total Bilirubin: 0.4 mg/dL (ref 0.2–1.2)
Total Protein: 7.4 g/dL (ref 6.1–8.1)

## 2022-03-12 LAB — HEMOGLOBIN A1C
Hgb A1c MFr Bld: 6.3 % of total Hgb — ABNORMAL HIGH (ref <5.7)
Mean Plasma Glucose: 134 mg/dL
eAG (mmol/L): 7.4 mmol/L

## 2022-03-12 LAB — LIPID PANEL
Cholesterol: 169 mg/dL (ref <200)
HDL: 61 mg/dL (ref >=50)
LDL Cholesterol (Calc): 88 mg/dL (calc)
Non-HDL Cholesterol (Calc): 108 mg/dL (calc) (ref <130)
Total CHOL/HDL Ratio: 2.8 (calc) (ref <5.0)
Triglycerides: 106 mg/dL (ref <150)

## 2022-03-12 LAB — VITAMIN B12: Vitamin B-12: 628 pg/mL (ref 200–1100)

## 2022-03-13 NOTE — Progress Notes (Signed)
Awesome job!.  Labs have improved!  Keep up the great work!!!

## 2022-04-22 ENCOUNTER — Ambulatory Visit
Admission: RE | Admit: 2022-04-22 | Discharge: 2022-04-22 | Disposition: A | Discharge status: Home / Self Care | Payer: BC Managed Care – PPO | Source: Ambulatory Visit | Attending: Family Medicine | Admitting: Family Medicine

## 2022-04-22 DIAGNOSIS — Z1231 Encounter for screening mammogram for malignant neoplasm of breast: Secondary | ICD-10-CM

## 2022-04-26 LAB — HM MAMMOGRAPHY

## 2022-04-27 ENCOUNTER — Encounter: Payer: Self-pay | Admitting: Family Medicine

## 2022-06-05 ENCOUNTER — Encounter (HOSPITAL_BASED_OUTPATIENT_CLINIC_OR_DEPARTMENT_OTHER): Payer: Self-pay

## 2022-06-05 ENCOUNTER — Emergency Department (HOSPITAL_BASED_OUTPATIENT_CLINIC_OR_DEPARTMENT_OTHER): Payer: BC Managed Care – PPO | Admitting: Radiology

## 2022-06-05 ENCOUNTER — Emergency Department (HOSPITAL_BASED_OUTPATIENT_CLINIC_OR_DEPARTMENT_OTHER)
Admission: EM | Admit: 2022-06-05 | Discharge: 2022-06-05 | Disposition: A | Discharge status: Home / Self Care | Payer: BC Managed Care – PPO | Attending: Emergency Medicine | Admitting: Emergency Medicine

## 2022-06-05 ENCOUNTER — Other Ambulatory Visit: Payer: Self-pay

## 2022-06-05 DIAGNOSIS — R0789 Other chest pain: Secondary | ICD-10-CM | POA: Diagnosis not present

## 2022-06-05 DIAGNOSIS — E119 Type 2 diabetes mellitus without complications: Secondary | ICD-10-CM | POA: Diagnosis not present

## 2022-06-05 DIAGNOSIS — Z1152 Encounter for screening for COVID-19: Secondary | ICD-10-CM | POA: Insufficient documentation

## 2022-06-05 DIAGNOSIS — R051 Acute cough: Secondary | ICD-10-CM | POA: Diagnosis not present

## 2022-06-05 DIAGNOSIS — R0602 Shortness of breath: Secondary | ICD-10-CM | POA: Diagnosis not present

## 2022-06-05 DIAGNOSIS — R059 Cough, unspecified: Secondary | ICD-10-CM | POA: Diagnosis not present

## 2022-06-05 LAB — RESP PANEL BY RT-PCR (RSV, FLU A&B, COVID)  RVPGX2
Influenza A by PCR: NEGATIVE
Influenza B by PCR: NEGATIVE
Resp Syncytial Virus by PCR: NEGATIVE
SARS Coronavirus 2 by RT PCR: NEGATIVE

## 2022-06-05 MED ORDER — AZITHROMYCIN 250 MG PO TABS: 250.0000 mg | ORAL_TABLET | Freq: Every day | ORAL | 0 refills | Status: DC

## 2022-06-05 MED ORDER — CEFUROXIME AXETIL 500 MG PO TABS: 500.0000 mg | ORAL_TABLET | Freq: Two times a day (BID) | ORAL | 0 refills | Status: DC

## 2022-06-05 NOTE — ED Provider Notes (Signed)
[facility] Provider Note   CSN: [id number] Arrival date & time: 06/05/22  1218     History  Chief Complaint  Patient presents with   Cough    Sheri Liu is a 67 y.o. female.   Cough   67 year old female presents emergency department with complaints of cough, subjective fever/chills, chest pain with cough.  Patient states that she was with granddaughter earlier this week who is ill with respiratory symptoms.  She states that she began to experience symptoms approximately 3 days ago.  Has been trying at home Mucinex, elderberry/other vitamins/supplements which has helped some.  Presents emergency department for further assessment.  Describes chest pain and shortness of breath worsened during coughing episodes.  Denies abdominal pain, nausea, vomiting, urinary symptoms, change in bowel habits.  Past medical history significant for hypercholesterolemia, diabetes mellitus, glaucoma, obesity  Home Medications Prior to Admission medications   Medication Sig Start Date End Date Taking? Authorizing Provider  azithromycin (ZITHROMAX) 250 MG tablet Take 1 tablet (250 mg total) by mouth daily. Take first 2 tablets together, then 1 every day until finished. 06/05/22  Yes Dion Saucier A, PA  cefUROXime (CEFTIN) 500 MG tablet Take 1 tablet (500 mg total) by mouth 2 (two) times daily with a meal. 06/05/22  Yes Dion Saucier A, PA  brimonidine (ALPHAGAN P) 0.1 % SOLN PLACE 1 DROP INTO BOTH EYES 3 TIMES DAILY. 06/22/21   [provider]  dorzolamide-timolol (COSOPT) 22.3-6.8 MG/ML ophthalmic solution INSTILL 1 DROP INTO BOTH EYES TWICE A DAY**PLEASE CALL FOR APPOINTMENT**    [provider]  latanoprost (XALATAN) 0.005 % ophthalmic solution Place 1 drop into both eyes nightly. 10/05/20   [provider]  tirzepatide Darcel Bayley) 2.5 MG/0.5ML Pen Inject 2.5 mg into the skin once a week. 03/11/22   Tawnya Crook, MD       Allergies    Patient has no known allergies.    Review of Systems   Review of Systems  Respiratory:  Positive for cough.   All other systems reviewed and are negative.   Physical Exam Updated Vital Signs BP 130/74 (BP Location: Right Arm)   Pulse 88   Temp 99.1 F (37.3 C) (Oral)   Resp 18   Ht 5\' 5"  (1.651 m)   Wt 108 kg   SpO2 99%   BMI 39.61 kg/m  Physical Exam Vitals and nursing note reviewed.  Constitutional:      General: She is not in acute distress.    Appearance: She is well-developed.  HENT:     Head: Normocephalic and atraumatic.  Eyes:     Conjunctiva/sclera: Conjunctivae normal.  Cardiovascular:     Rate and Rhythm: Normal rate and regular rhythm.     Heart sounds: No murmur heard. Pulmonary:     Effort: Pulmonary effort is normal. No respiratory distress.     Breath sounds: Rales present.     Comments: Mild rales auscultated right greater than left lower lung fields. Abdominal:     Palpations: Abdomen is soft.     Tenderness: There is no abdominal tenderness. There is no right CVA tenderness or left CVA tenderness.  Musculoskeletal:        General: No swelling.     Cervical back: Neck supple.  Skin:    General: Skin is warm and dry.     Capillary Refill: Capillary refill takes less than 2 seconds.  Neurological:     Mental Status: She is  alert.  Psychiatric:        Mood and Affect: Mood normal.     ED Results / Procedures / Treatments   Labs (all labs ordered are listed, but only abnormal results are displayed) Labs Reviewed  RESP PANEL BY RT-PCR (RSV, FLU A&B, COVID)  [id number]    EKG EKG Interpretation  Date/Time:  Sunday June 05 2022 12:47:45 EDT Ventricular Rate:  88 PR Interval:  164 QRS Duration: 66 QT Interval:  336 QTC Calculation: 406 R Axis:   53 Text Interpretation: Normal sinus rhythm Low voltage QRS Borderline ECG No previous ECGs available Confirmed by Lennice Sites ([id number]) on 06/05/2022 12:52:53 PM  Radiology DG  Chest 2 View  Result Date: 06/05/2022 CLINICAL DATA:  67 year old female with history of shortness of breath, cough and congestion for the past 3 days. EXAM: CHEST - 2 VIEW COMPARISON:  Chest x-ray 07/05/2013. FINDINGS: Lung volumes are normal. No consolidative airspace disease. No pleural effusions. No pneumothorax. No pulmonary nodule or mass noted. Pulmonary vasculature and the cardiomediastinal silhouette are within normal limits. IMPRESSION: No radiographic evidence of acute cardiopulmonary disease. Electronically Signed   By: Vinnie Langton M.D.   On: 06/05/2022 13:50    Procedures Procedures    Medications Ordered in ED Medications - No data to display  ED Course/ Medical Decision Making/ A&P Clinical Course as of 06/05/22 1401  Sun Jun 05, 2022  1308 Mucinex, Rudene Re R>L [CR]    Clinical Course User Index [CR] Wilnette Kales, [place]                             Medical Decision Making Amount and/or Complexity of Data Reviewed Radiology: ordered.  Risk Prescription drug management.   This patient presents to the ED for concern of upper respiratory illness, this involves an extensive number of treatment options, and is a complaint that carries with it a high risk of complications and morbidity.  The differential diagnosis includes COVID, influenza, RSV, pneumonia, sepsis, meningitis   Co morbidities that complicate the patient evaluation  See HPI   Additional history obtained:  Additional history obtained from EMR External records from outside source obtained and reviewed including hospital records   Lab Tests:  I Ordered, and personally interpreted labs.  The pertinent results include: Respiratory viral panel negative   Imaging Studies ordered:  I ordered imaging studies including chest x-ray I independently visualized and interpreted imaging which showed no acute cardiopulmonary abnormalities I agree with the radiologist  interpretation   Cardiac Monitoring: / EKG:  The patient was maintained on a cardiac monitor.  I personally viewed and interpreted the cardiac monitored which showed an underlying rhythm of: Sinus rhythm with no acute ischemic changes   Consultations Obtained:  N/a   Problem List / ED Course / Critical interventions / Medication management  Cough Reevaluation of the patient showed that the patient stayed the same I have reviewed the patients home medicines and have made adjustments as needed   Social Determinants of Health:  Denies tobacco, illicit drug use.   Test / Admission - Considered:  Cough Vitals signs  within normal range and stable throughout visit. Laboratory/imaging studies significant for: See above 67 year old female presents emergency department with complaints of cough x 3 days.  Workup today overall reassuring.  Patient negative for COVID, influenza, RSV with chest x-ray without acute abnormality.  Given physical exam findings, some concern for underlying pneumonia not depicted  on x-ray imaging.  Will treat empirically with community-acquired pneumonia coverage.  Patient recommended symptomatic therapy with Tylenol/Motrin as needed for pain/fever.  Patient overall well-appearing, not hypoxic, afebrile in no acute respiratory distress.  Close follow-up recommended with primary care provider for reassessment of symptoms.  Treatment plan discussed at length with patient and she acknowledged understanding was agreeable to said plan. Worrisome signs and symptoms were discussed with the patient, and the patient acknowledged understanding to return to the ED if noticed. Patient was stable upon discharge.          Final Clinical Impression(s) / ED Diagnoses Final diagnoses:  Acute cough    Rx / DC Orders ED Discharge Orders          Ordered    azithromycin (ZITHROMAX) 250 MG tablet  Daily        06/05/22 1357                   Wilnette Kales,  [place] 06/05/22 1401    Lennice Sites, DO 06/05/22 1458

## 2022-06-05 NOTE — Discharge Instructions (Signed)
With your chest x-ray was without observable abnormality.  You tested negative for COVID, influenza, RSV.  Given high your lungs sound accompanied with your symptoms, will treat empirically for pneumonia.  Take antibiotics as directed.  Continue take Tylenol/Motrin as needed for pain/fevers.  Recommend reevaluation by primary care for reassessment of your symptoms.  Please do not hesitate to return to emergency department for worrisome signs and symptoms we discussed become apparent.

## 2022-06-05 NOTE — ED Notes (Signed)
Pt discharged to home, NAD noted at time of discharge 

## 2022-06-05 NOTE — ED Triage Notes (Signed)
Patient here POV from Home.  Endorses Mostly Dry Cough that began 3 Days ago. Worsened since. No Known Fevers. Noted some Chest Discomfort when Coughing and SOB today.  NAD Noted during Triage. A&Ox4. Gcs 15. Ambulatory.

## 2022-06-08 ENCOUNTER — Ambulatory Visit: Payer: BC Managed Care – PPO | Admitting: Family Medicine

## 2022-06-08 ENCOUNTER — Encounter: Payer: Self-pay | Admitting: Family Medicine

## 2022-06-08 VITALS — BP 118/70 | HR 83 | Temp 98.2°F | Ht 65.0 in | Wt 240.5 lb

## 2022-06-08 DIAGNOSIS — J40 Bronchitis, not specified as acute or chronic: Secondary | ICD-10-CM

## 2022-06-08 MED ORDER — GUAIFENESIN-CODEINE 100-10 MG/5ML PO SOLN: 5.0000 mL | Freq: Three times a day (TID) | ORAL | 0 refills | Status: DC | PRN

## 2022-06-08 MED ORDER — CEFDINIR 300 MG PO CAPS: 300.0000 mg | ORAL_CAPSULE | Freq: Two times a day (BID) | ORAL | 0 refills | Status: DC

## 2022-06-08 MED ORDER — BENZONATATE 100 MG PO CAPS: 100.0000 mg | ORAL_CAPSULE | Freq: Three times a day (TID) | ORAL | 0 refills | Status: DC | PRN

## 2022-06-08 MED ORDER — ALBUTEROL SULFATE HFA 108 (90 BASE) MCG/ACT IN AERS: 2.0000 | INHALATION_SPRAY | Freq: Four times a day (QID) | RESPIRATORY_TRACT | 1 refills | Status: DC | PRN

## 2022-06-08 NOTE — Progress Notes (Signed)
Subjective:     Patient ID: Sheri Liu, female    DOB: 1956-02-20, 67 y.o.   MRN: [id number]  Chief Complaint  Patient presents with   Follow-up    ED follow-up from 06/05/22    HPI Follow up from 06/05/22 ER - cough, fever/chills, chest pain/shortness of breath rales RIGHT LOWER LOBE and LEFT LOWER LOBE   chest xray negative, EKG ok, negative COVID/RSV/influenza.  Gave zpk and ceftin for presumed Community Acquired Pneumonia (CAP).    Some better but cough still bad and disturbing sleep.  Some shortness of breath.  History of chronic bronchitis.  Hard to swallow the pills.  Wants caps   Health Maintenance Due  Topic Date Due   OPHTHALMOLOGY EXAM  Never done   DTaP/Tdap/Td (3 - Tdap) 03/07/2020    Past Medical History:  Diagnosis Date   Diabetes mellitus without complication [id number]   hx well controlled. A1c 05/2020 6.3% with [facility] Alzheimer's prevention trial data/labs   Glaucoma 11-13   Hypercholesterolemia 01/2019   recommended statin 01/2019   Obesity, Class III, BMI 40-49.9 (morbid obesity)    Going to [employer] [facility] to do their wt loss program as of 02/2013   Tinnitus 06/14/2011   transient    Past Surgical History:  Procedure Laterality Date   ABDOMINAL HYSTERECTOMY  1996   partial- still has ovaries, for fibroids, heavy bleeding   COLONOSCOPY  approx 2009   in [place]: pt reports that it was NORMAL   COLONOSCOPY  08/2016   NORMAL per pt   KNEE ARTHROSCOPY Left 1989   left, arthroscopy to clean up meniscus    Outpatient Medications Prior to Visit  Medication Sig Dispense Refill   azithromycin (ZITHROMAX) 250 MG tablet Take 1 tablet (250 mg total) by mouth daily. Take first 2 tablets together, then 1 every day until finished. 6 tablet 0   brimonidine (ALPHAGAN P) 0.1 % SOLN PLACE 1 DROP INTO BOTH EYES 3 TIMES DAILY.     dorzolamide-timolol (COSOPT) 22.3-6.8 MG/ML ophthalmic solution INSTILL 1 DROP INTO BOTH EYES TWICE A DAY**PLEASE CALL FOR  APPOINTMENT**     LACTOBACILLUS PO [facility] probiotics daily by [facility]: 1 capsule daily     latanoprost (XALATAN) 0.005 % ophthalmic solution Place 1 drop into both eyes nightly.     cefUROXime (CEFTIN) 500 MG tablet Take 1 tablet (500 mg total) by mouth 2 (two) times daily with a meal. 12 tablet 0   OZEMPIC, 0.25 OR 0.5 MG/DOSE, 2 MG/3ML SOPN Inject into the skin. (Patient not taking: Reported on 06/08/2022)     tirzepatide [facility]) 2.5 MG/0.5ML Pen Inject 2.5 mg into the skin once a week. (Patient not taking: Reported on 06/08/2022) 2 mL 0   No facility-administered medications prior to visit.    No Known Allergies ROS neg/noncontributory except as noted HPI/below      Objective:     BP 118/70   Pulse 83   Temp 98.2 F (36.8 C) (Temporal)   Ht 5\' 5"  (1.651 m)   Wt 240 lb 8 oz (109.1 kg)   SpO2 93%   BMI 40.02 kg/m  Wt Readings from Last 3 Encounters:  06/08/22 240 lb 8 oz (109.1 kg)  06/05/22 238 lb (108 kg)  03/11/22 254 lb 8 oz (115.4 kg)    Physical Exam   Gen: WDWN NAD HEENT: NCAT, conjunctiva not injected, sclera nonicteric TM WNL B, OP moist, no exudates  + congested NECK:  supple, no thyromegaly, no nodes,  no carotid bruits CARDIAC: RRR, S1S2+, no murmur.  LUNGS: CTAB. No wheezes, some rhonchi in bases EXT:  no edema MSK: no gross abnormalities.  NEURO: A&O x3.  CN II-XII intact.  PSYCH: normal mood. Good eye contact  [place] emergency room records     Assessment & Plan:   Problem List Items Addressed This Visit   None Visit Diagnoses     Bronchitis    -  Primary     1.  Bronchitis-some better.  Patient has trouble swallowing the pills.  Only has 2 doses of Z-Pak left-she will try to get that down (advised to try pudding or something similar).  Will change Ceftin to Omnicef 300 mg twice daily as they are capsules.  For cough, Tessalon Perles 100 mg 3 times daily as needed, albuterol inhaler 2 puffs every 4-6 as needed (she already knows how to  use it).  Lastly, guaifenesin codeine cough syrup-PDMP was checked.  Advised that she may not be able to get it from the pharmacy as there have been shortages.  Schedule regular follow-up for other chronic problems.  Meds ordered this encounter  Medications   cefdinir (OMNICEF) 300 MG capsule    Sig: Take 1 capsule (300 mg total) by mouth 2 (two) times daily.    Dispense:  10 capsule    Refill:  0   benzonatate (TESSALON PERLES) 100 MG capsule    Sig: Take 1 capsule (100 mg total) by mouth 3 (three) times daily as needed for cough.    Dispense:  20 capsule    Refill:  0   albuterol (VENTOLIN HFA) 108 (90 Base) MCG/ACT inhaler    Sig: Inhale 2 puffs into the lungs every 6 (six) hours as needed for wheezing or shortness of breath.    Dispense:  8 g    Refill:  1   guaiFENesin-codeine 100-10 MG/5ML syrup    Sig: Take 5 mLs by mouth 3 (three) times daily as needed for cough.    Dispense:  120 mL    Refill:  0    Wellington Hampshire, MD

## 2022-06-08 NOTE — Patient Instructions (Addendum)
So finish the zpack(azithromycin)  Change the cefin(cefuroxime) to the new one-omnicef(cefdinir).  Inhaler, tessalon Perles and cough syrup

## 2022-07-08 ENCOUNTER — Ambulatory Visit: Payer: BC Managed Care – PPO | Admitting: Family Medicine

## 2022-07-13 IMAGING — MG MM DIGITAL SCREENING BILAT W/ TOMO AND CAD
8 series · 8 of 24 positions shown · non-contrast
Comparison: Previous exam(s).

CLINICAL DATA: Screening.

EXAM:
DIGITAL SCREENING BILATERAL MAMMOGRAM WITH TOMOSYNTHESIS AND CAD
TECHNIQUE: Bilateral screening digital craniocaudal and mediolateral oblique
mammograms were obtained. Bilateral screening digital breast
tomosynthesis was performed. The images were evaluated with
computer-aided detection.

[R CC synth-2D]
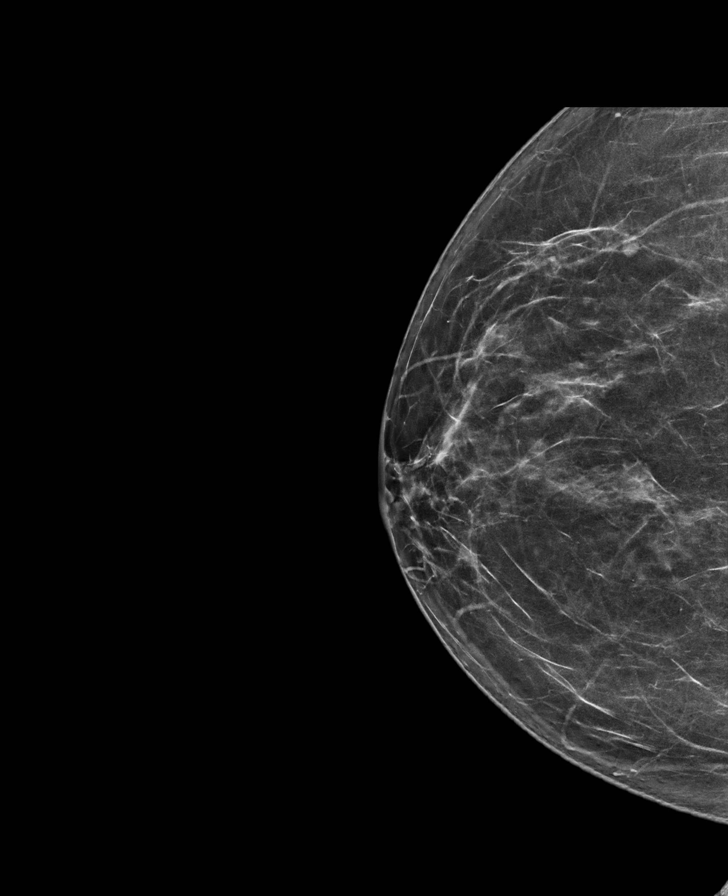

[L MLO synth-2D]
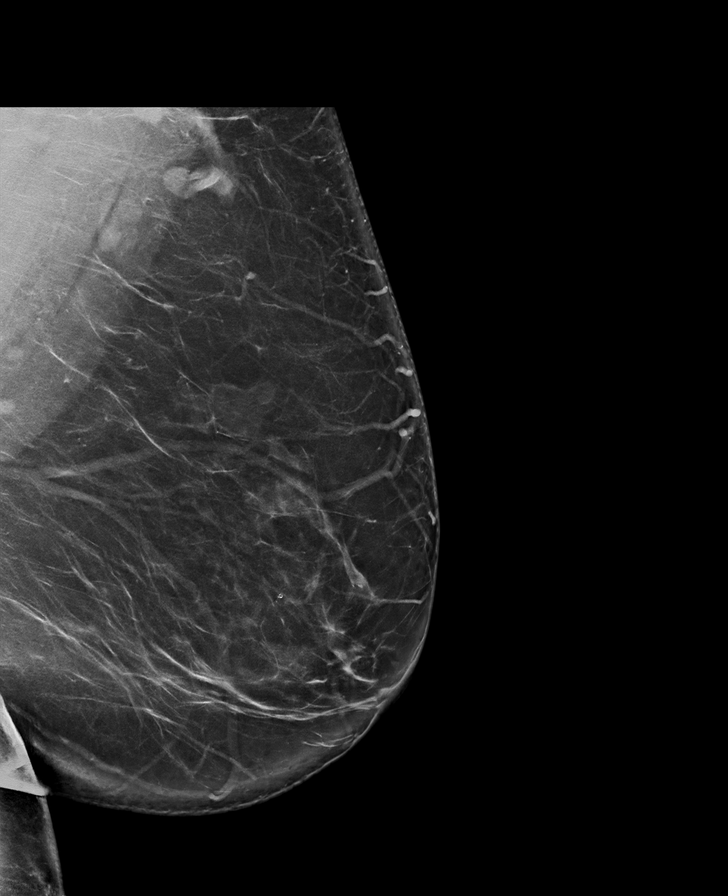

[R MLO synth-2D]
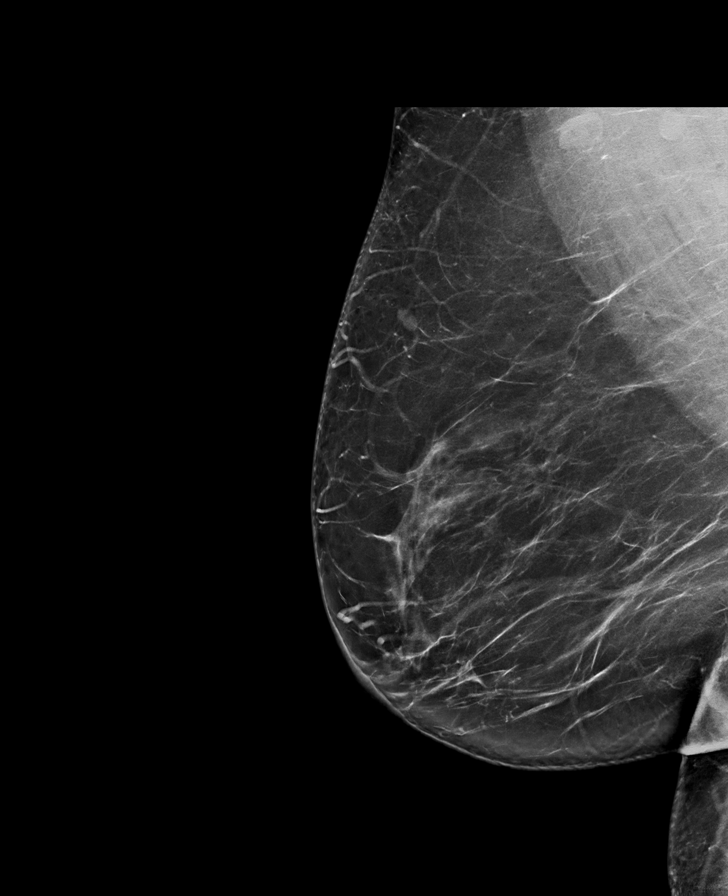

[L CC synth-2D]
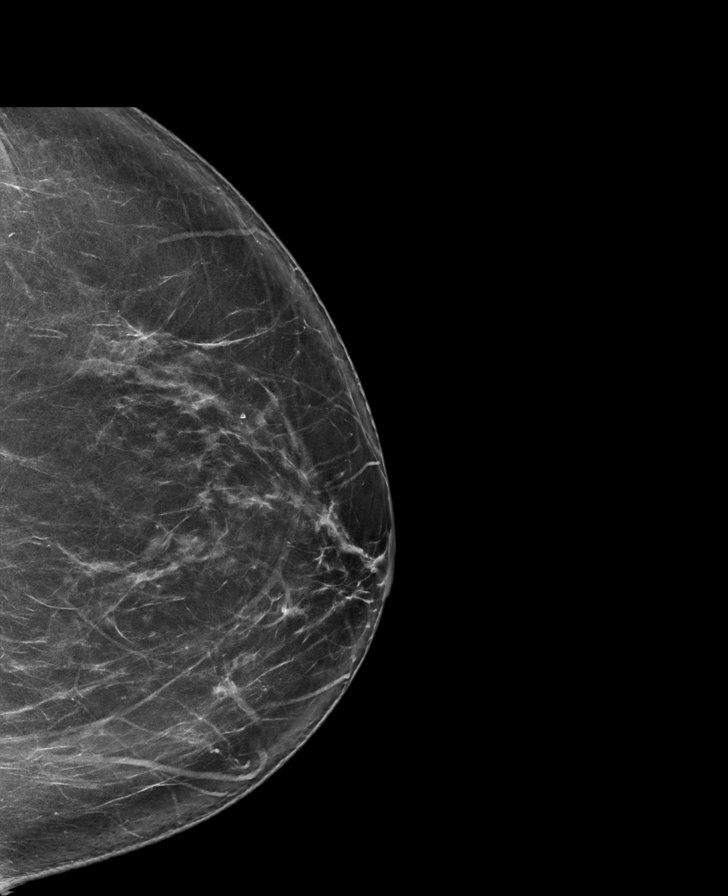

[L CC tomo · tomo slice 43/85.0]
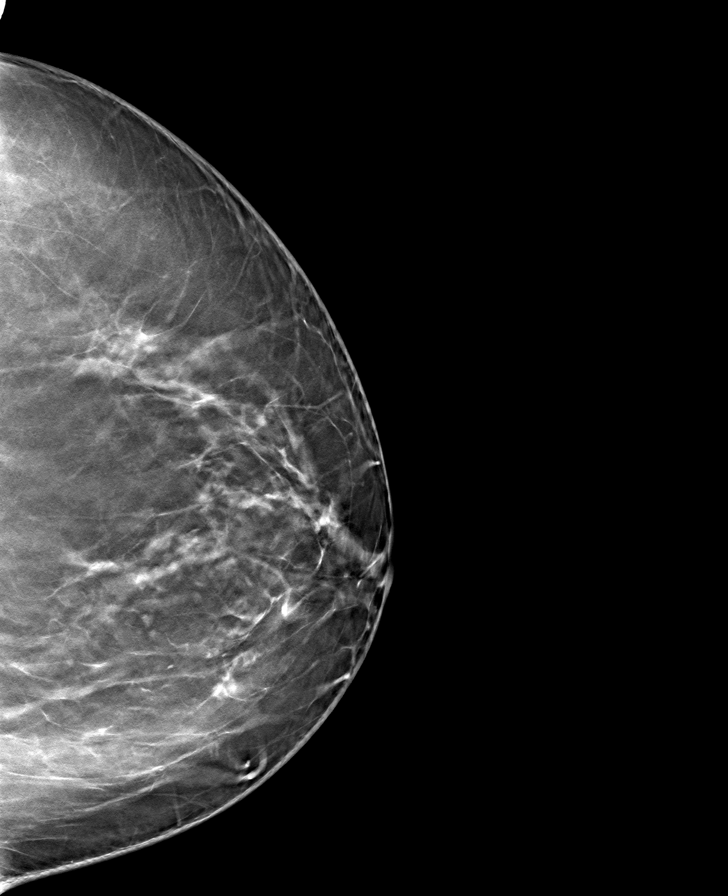

[R MLO tomo · tomo slice 47/94.0]
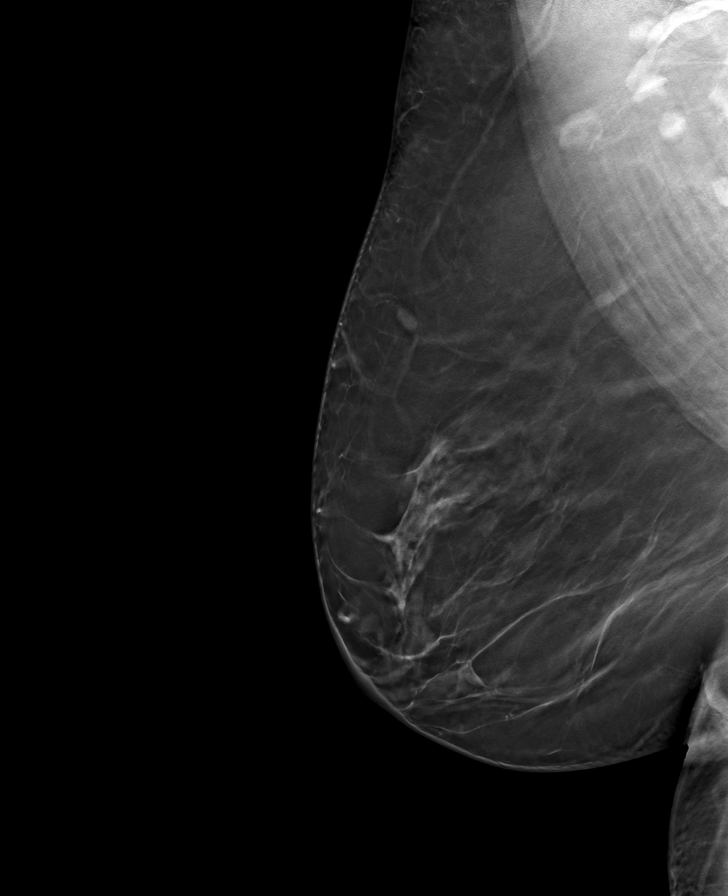

[R CC tomo · tomo slice 37/72.0]
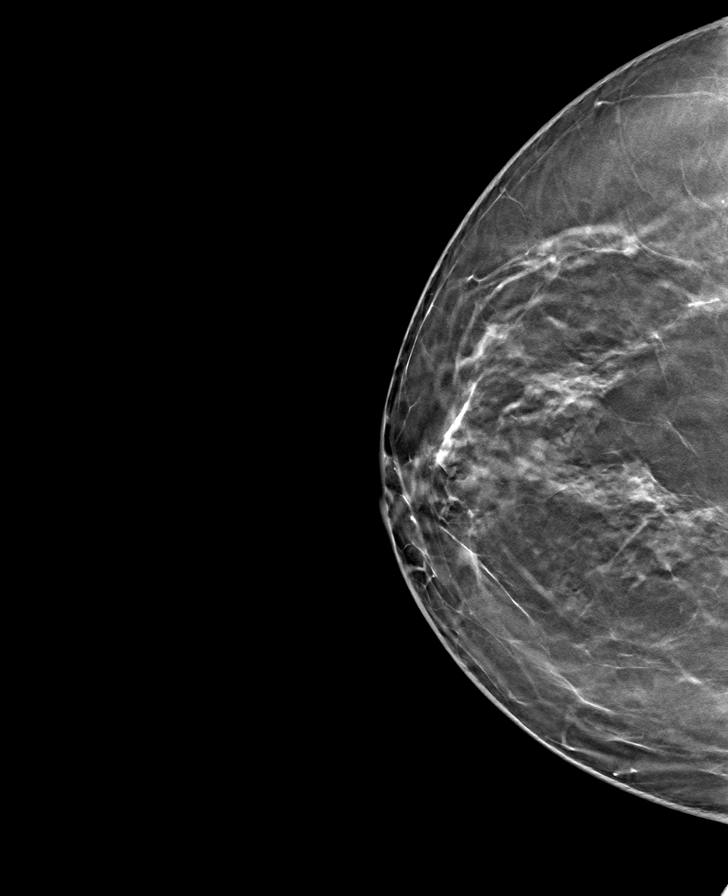

[L MLO tomo · tomo slice 51/101.0]
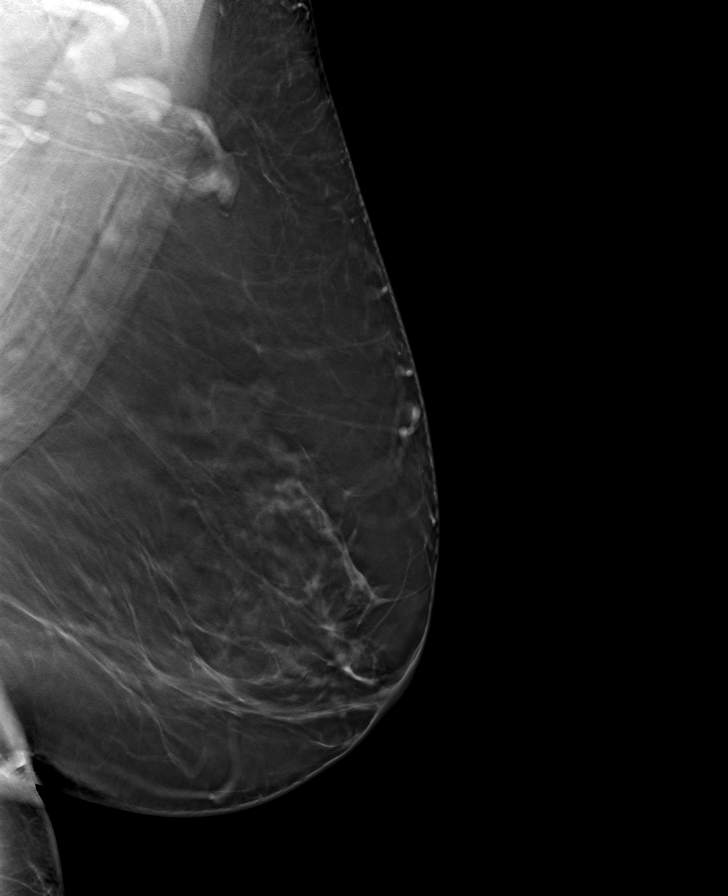

[8 of 24 positions shown; findings below may reference images not displayed]

ACR Breast Density Category b: There are scattered areas of
fibroglandular density.
FINDINGS: There are no findings suspicious for malignancy.
IMPRESSION: No mammographic evidence of malignancy. A result letter of this
screening mammogram will be mailed directly to the patient.

RECOMMENDATION:
Screening mammogram in one year. (Code:51-O-LD2)

BI-RADS CATEGORY  1: Negative.

## 2022-07-22 ENCOUNTER — Ambulatory Visit: Payer: BC Managed Care – PPO | Admitting: Family Medicine

## 2022-07-22 ENCOUNTER — Encounter: Payer: Self-pay | Admitting: Family Medicine

## 2022-07-22 VITALS — BP 118/80 | HR 81 | Temp 98.1°F | Resp 16 | Ht 66.0 in | Wt 238.6 lb

## 2022-07-22 DIAGNOSIS — E119 Type 2 diabetes mellitus without complications: Secondary | ICD-10-CM

## 2022-07-22 DIAGNOSIS — E781 Pure hyperglyceridemia: Secondary | ICD-10-CM | POA: Diagnosis not present

## 2022-07-22 LAB — CBC WITH DIFFERENTIAL/PLATELET
Basophils Absolute: 0.1 10*3/uL (ref 0.0–0.1)
Basophils Relative: 0.9 % (ref 0.0–3.0)
Eosinophils Absolute: 0.2 10*3/uL (ref 0.0–0.7)
Eosinophils Relative: 3.1 % (ref 0.0–5.0)
HCT: 41.1 % (ref 36.0–46.0)
Hemoglobin: 13.5 g/dL (ref 12.0–15.0)
Lymphocytes Relative: 44.2 % (ref 12.0–46.0)
Lymphs Abs: 2.6 10*3/uL (ref 0.7–4.0)
MCHC: 32.9 g/dL (ref 30.0–36.0)
MCV: 92.9 fl (ref 78.0–100.0)
Monocytes Absolute: 0.4 10*3/uL (ref 0.1–1.0)
Monocytes Relative: 7.6 % (ref 3.0–12.0)
Neutro Abs: 2.6 10*3/uL (ref 1.4–7.7)
Neutrophils Relative %: 44.2 % (ref 43.0–77.0)
Platelets: 219 10*3/uL (ref 150.0–400.0)
RBC: 4.42 Mil/uL (ref 3.87–5.11)
RDW: 14.3 % (ref 11.5–15.5)
WBC: 5.9 10*3/uL (ref 4.0–10.5)

## 2022-07-22 LAB — HEMOGLOBIN A1C: Hgb A1c MFr Bld: 6.1 % (ref 4.6–6.5)

## 2022-07-22 LAB — LIPID PANEL
Cholesterol: 170 mg/dL (ref 0–200)
HDL: 54.4 mg/dL (ref >39.00)
LDL Cholesterol: 89 mg/dL (ref 0–99)
NonHDL: 115.96
Total CHOL/HDL Ratio: 3
Triglycerides: 133 mg/dL (ref 0.0–149.0)
VLDL: 26.6 mg/dL (ref 0.0–40.0)

## 2022-07-22 LAB — COMPREHENSIVE METABOLIC PANEL
ALT: 15 U/L (ref 0–35)
AST: 17 U/L (ref 0–37)
Albumin: 4 g/dL (ref 3.5–5.2)
Alkaline Phosphatase: 69 U/L (ref 39–117)
BUN: 18 mg/dL (ref 6–23)
CO2: 26 mEq/L (ref 19–32)
Calcium: 9.2 mg/dL (ref 8.4–10.5)
Chloride: 106 mEq/L (ref 96–112)
Creatinine, Ser: 0.78 mg/dL (ref 0.40–1.20)
GFR: 77.72 mL/min (ref >60.00)
Glucose, Bld: 88 mg/dL (ref 70–99)
Potassium: 4.7 mEq/L (ref 3.5–5.1)
Sodium: 139 mEq/L (ref 135–145)
Total Bilirubin: 0.3 mg/dL (ref 0.2–1.2)
Total Protein: 7 g/dL (ref 6.0–8.3)

## 2022-07-22 LAB — TSH: TSH: 1.78 u[IU]/mL (ref 0.35–5.50)

## 2022-07-22 LAB — MICROALBUMIN / CREATININE URINE RATIO
Creatinine,U: 109.3 mg/dL
Microalb Creat Ratio: 0.6 mg/g (ref 0.0–30.0)
Microalb, Ur: <0.7 mg/dL (ref 0.0–1.9)

## 2022-07-22 NOTE — Progress Notes (Signed)
Subjective:     Patient ID: YUDI PELCZAR, female    DOB: 1955/03/18, 67 y.o.   MRN: [id number]  Chief Complaint  Patient presents with   Diabetes    HPI-going to [place] in June  DM type 2-working on diet/exercise.  Mounjaro not approved.  Ophth-end of month.  Mixed HLD-mainly trigs.  Woking on diet/exercise. Doing well.   Health Maintenance Due  Topic Date Due   OPHTHALMOLOGY EXAM  Never done   DTaP/Tdap/Td (3 - Tdap) 03/07/2020   Diabetic kidney evaluation - Urine ACR  08/13/2022    Past Medical History:  Diagnosis Date   Diabetes mellitus without complication (HCC) 01/21/2013   hx well controlled. A1c 05/2020 6.3% with [facility] Alzheimer's prevention trial data/labs   Glaucoma 11-13   Hypercholesterolemia 01/2019   recommended statin 01/2019   Obesity, Class III, BMI 40-49.9 (morbid obesity) (HCC)    Going to [facility] to do their wt loss program as of 02/2013   Tinnitus 06/14/2011   transient    Past Surgical History:  Procedure Laterality Date   ABDOMINAL HYSTERECTOMY  1996   partial- still has ovaries, for fibroids, heavy bleeding   COLONOSCOPY  approx 2009   in [place]: pt reports that it was NORMAL   COLONOSCOPY  08/2016   NORMAL per pt   KNEE ARTHROSCOPY Left 1989   left, arthroscopy to clean up meniscus     Current Outpatient Medications:    albuterol (VENTOLIN HFA) 108 (90 Base) MCG/ACT inhaler, Inhale 2 puffs into the lungs every 6 (six) hours as needed for wheezing or shortness of breath., Disp: 8 g, Rfl: 1   brimonidine (ALPHAGAN P) 0.1 % SOLN, PLACE 1 DROP INTO BOTH EYES 3 TIMES DAILY., Disp: , Rfl:    dorzolamide-timolol (COSOPT) 22.3-6.8 MG/ML ophthalmic solution, INSTILL 1 DROP INTO BOTH EYES TWICE A DAY**PLEASE CALL FOR APPOINTMENT**, Disp: , Rfl:    LACTOBACILLUS PO, [facility] probiotics daily by Sch[facility]: 1 capsule daily, Disp: , Rfl:    latanoprost (XALATAN) 0.005 % ophthalmic solution, Place 1 drop into both eyes nightly.,  Disp: , Rfl:   No Known Allergies ROS neg/noncontributory except as noted HPI/below      Objective:     BP 118/80   Pulse 81   Temp 98.1 F (36.7 C) (Temporal)   Resp 16   Ht 5\' 6"  (1.676 m)   Wt 238 lb 9.6 oz (108.2 kg)   SpO2 98%   BMI 38.51 kg/m  Wt Readings from Last 3 Encounters:  07/22/22 238 lb 9.6 oz (108.2 kg)  06/08/22 240 lb 8 oz (109.1 kg)  06/05/22 238 lb (108 kg)    Physical Exam   Gen: WDWN NAD HEENT: NCAT, conjunctiva not injected, sclera nonicteric NECK:  supple, no thyromegaly, no nodes, no carotid bruits CARDIAC: RRR, S1S2+, no murmur. DP 2+B LUNGS: CTAB. No wheezes ABDOMEN:  BS+, soft, NTND, No HSM, no masses EXT:  no edema MSK: no gross abnormalities.  NEURO: A&O x3.  CN II-XII intact.  PSYCH: normal mood. Good eye contact     Assessment & Plan:  Type 2 diabetes mellitus without complication, without long-term current use of insulin (HCC) Assessment & Plan: Chronic.  Controlled w/diet/exercise.  Insurance not cover [employer].  Check labs  Orders: -     Lipid panel -     Comprehensive metabolic panel -     CBC with Differential/Platelet -     Hemoglobin A1c -     TSH -  Microalbumin / creatinine urine ratio  Hypertriglyceridemia Assessment & Plan: Chronic.  Controlled w/diet/exercise.  Check labs  Orders: -     Lipid panel -     Comprehensive metabolic panel -     TSH    Angelena Sole, MD

## 2022-07-22 NOTE — Patient Instructions (Signed)
It was very nice to see you today!  Keep up the great work!   PLEASE NOTE:  If you had any lab tests please let us know if you have not heard back within a few days. You may see your results on MyChart before we have a chance to review them but we will give you a call once they are reviewed by us. If we ordered any referrals today, please let us know if you have not heard from their office within the next week.   Please try these tips to maintain a healthy lifestyle:  Eat most of your calories during the day when you are active. Eliminate processed foods including packaged sweets (pies, cakes, cookies), reduce intake of potatoes, white bread, white pasta, and white rice. Look for whole grain options, oat flour or almond flour.  Each meal should contain half fruits/vegetables, one quarter protein, and one quarter carbs (no bigger than a computer mouse).  Cut down on sweet beverages. This includes juice, soda, and sweet tea. Also watch fruit intake, though this is a healthier sweet option, it still contains natural sugar! Limit to 3 servings daily.  Drink at least 1 glass of water with each meal and aim for at least 8 glasses per day  Exercise at least 150 minutes every week.   

## 2022-07-22 NOTE — Assessment & Plan Note (Signed)
Chronic.  Controlled w/diet/exercise.  Check labs

## 2022-07-22 NOTE — Assessment & Plan Note (Signed)
Chronic.  Controlled w/diet/exercise.  Insurance not cover [employer].  Check labs

## 2022-07-24 NOTE — Progress Notes (Signed)
Excellent-same medications.  Work on diet/exercise.

## 2022-08-08 DIAGNOSIS — H401122 Primary open-angle glaucoma, left eye, moderate stage: Secondary | ICD-10-CM | POA: Diagnosis not present

## 2022-08-08 DIAGNOSIS — H401111 Primary open-angle glaucoma, right eye, mild stage: Secondary | ICD-10-CM | POA: Diagnosis not present

## 2022-10-05 ENCOUNTER — Encounter (INDEPENDENT_AMBULATORY_CARE_PROVIDER_SITE_OTHER): Payer: Self-pay

## 2022-10-20 ENCOUNTER — Encounter (INDEPENDENT_AMBULATORY_CARE_PROVIDER_SITE_OTHER): Payer: Self-pay

## 2022-10-21 ENCOUNTER — Ambulatory Visit: Payer: BC Managed Care – PPO | Admitting: Family Medicine

## 2022-11-10 NOTE — Progress Notes (Signed)
Subjective:     Patient ID: Sheri Liu, female    DOB: Nov 12, 1955, 67 y.o.   MRN: [id number]  Chief Complaint  Patient presents with   Medical Management of Chronic Issues    3 month follow-up on dm    HPI  DM, type 2 - Maintaining healthy diet and consistently exercise. Weight training and walking. Has trainer. States her goal weight is around 170 lbs. She tends to count her calories using her fitness app. Enjoys eating nuts (cashews, macadamia).   Hypercholesterolemia - Managing with healthy diet and exercise.  Cough - She reports having a dry cough and congestion for 5-7 days after recent travel. Tested negative for Covid at home. States it has been difficult for her to sleep. Took Mucinex to help. Went to travel clinic while traveling back. No fevers.   Elevated blood pressure - She states her blood pressure may be elevated due to her feeling unwell and taking Mucinex, which may contain sudafed. States her mask also makes her uncomfortable and claustrophobic. At recheck her Bp improved to 128/82.  Glaucoma - Doing well, no new concerns. Has been following up with her ophthalmologist every 6 months. Next follow up is 11/4.   Health Maintenance Due  Topic Date Due   OPHTHALMOLOGY EXAM  Never done   DTaP/Tdap/Td (3 - Tdap) 03/07/2020    Past Medical History:  Diagnosis Date   Diabetes mellitus without complication (HCC) 01/21/2013   hx well controlled. A1c 05/2020 6.3% with [facility] Alzheimer's prevention trial data/labs   Glaucoma 11-13   Hypercholesterolemia 01/2019   recommended statin 01/2019   Obesity, Class III, BMI 40-49.9 (morbid obesity) (HCC)    Going to [facility] to do their wt loss program as of 02/2013   Tinnitus 06/14/2011   transient    Past Surgical History:  Procedure Laterality Date   ABDOMINAL HYSTERECTOMY  1996   partial- still has ovaries, for fibroids, heavy bleeding   COLONOSCOPY  approx 2009   in [place]: pt reports that it was  NORMAL   COLONOSCOPY  08/2016   NORMAL per pt   KNEE ARTHROSCOPY Left 1989   left, arthroscopy to clean up meniscus     Current Outpatient Medications:    albuterol (VENTOLIN HFA) 108 (90 Base) MCG/ACT inhaler, Inhale 2 puffs into the lungs every 6 (six) hours as needed for wheezing or shortness of breath., Disp: 8 g, Rfl: 1   brimonidine (ALPHAGAN) 0.2 % ophthalmic solution, Apply to eye., Disp: , Rfl:    dorzolamide-timolol (COSOPT) 22.3-6.8 MG/ML ophthalmic solution, INSTILL 1 DROP INTO BOTH EYES TWICE A DAY**PLEASE CALL FOR APPOINTMENT**, Disp: , Rfl:    LACTOBACILLUS PO, [facility] [employer] probiotics daily by Schiff: 1 capsule daily, Disp: , Rfl:    latanoprost (XALATAN) 0.005 % ophthalmic solution, Place 1 drop into both eyes nightly., Disp: , Rfl:   No Known Allergies ROS neg/noncontributory except as noted HPI/below      Objective:     BP 128/82 (BP Location: Left Arm, Patient Position: Sitting, Cuff Size: Large)   Pulse 70   Temp 97.7 F (36.5 C) (Temporal)   Ht 5\' 6"  (1.676 m)   Wt 234 lb 3.2 oz (106.2 kg)   SpO2 99%   BMI 37.80 kg/m  Wt Readings from Last 3 Encounters:  11/11/22 234 lb 3.2 oz (106.2 kg)  07/22/22 238 lb 9.6 oz (108.2 kg)  06/08/22 240 lb 8 oz (109.1 kg)    Physical Exam   Gen:  WDWN NAD HEENT: NCAT, conjunctiva not injected, sclera nonicteric. TM WNL B, no exudates. NECK:  supple, no thyromegaly, no nodes, no carotid bruits CARDIAC: RRR, S1S2+, no murmur. DP 2+B LUNGS: CTAB. No wheezes ABDOMEN:  BS+, soft, NTND, No HSM, no masses EXT:  no edema MSK: no gross abnormalities.  NEURO: A&O x3.  CN II-XII intact.  PSYCH: normal mood. Good eye contact   Results for orders placed or performed in visit on 07/22/22  Lipid panel  Result Value Ref Range   Cholesterol 170 0 - 200 mg/dL   Triglycerides 301.6 0.0 - 149.0 mg/dL   HDL 01.09 >32.35 mg/dL   VLDL 57.3 0.0 - 22.0 mg/dL   LDL Cholesterol 89 0 - 99 mg/dL   Total CHOL/HDL Ratio 3     NonHDL 115.96   Comprehensive metabolic panel  Result Value Ref Range   Sodium 139 135 - 145 mEq/L   Potassium 4.7 3.5 - 5.1 mEq/L   Chloride 106 96 - 112 mEq/L   CO2 26 19 - 32 mEq/L   Glucose, Bld 88 70 - 99 mg/dL   BUN 18 6 - 23 mg/dL   Creatinine, Ser 2.54 0.40 - 1.20 mg/dL   Total Bilirubin 0.3 0.2 - 1.2 mg/dL   Alkaline Phosphatase 69 39 - 117 U/L   AST 17 0 - 37 U/L   ALT 15 0 - 35 U/L   Total Protein 7.0 6.0 - 8.3 g/dL   Albumin 4.0 3.5 - 5.2 g/dL   GFR 27.06 >23.76 mL/min   Calcium 9.2 8.4 - 10.5 mg/dL  CBC with Differential/Platelet  Result Value Ref Range   WBC 5.9 4.0 - 10.5 K/uL   RBC 4.42 3.87 - 5.11 Mil/uL   Hemoglobin 13.5 12.0 - 15.0 g/dL   HCT 28.3 15.1 - 76.1 %   MCV 92.9 78.0 - 100.0 fl   MCHC 32.9 30.0 - 36.0 g/dL   RDW 60.7 37.1 - 06.2 %   Platelets 219.0 150.0 - 400.0 K/uL   Neutrophils Relative % 44.2 43.0 - 77.0 %   Lymphocytes Relative 44.2 12.0 - 46.0 %   Monocytes Relative 7.6 3.0 - 12.0 %   Eosinophils Relative 3.1 0.0 - 5.0 %   Basophils Relative 0.9 0.0 - 3.0 %   Neutro Abs 2.6 1.4 - 7.7 K/uL   Lymphs Abs 2.6 0.7 - 4.0 K/uL   Monocytes Absolute 0.4 0.1 - 1.0 K/uL   Eosinophils Absolute 0.2 0.0 - 0.7 K/uL   Basophils Absolute 0.1 0.0 - 0.1 K/uL  Hemoglobin A1c  Result Value Ref Range   Hgb A1c MFr Bld 6.1 4.6 - 6.5 %  TSH  Result Value Ref Range   TSH 1.78 0.35 - 5.50 uIU/mL  Microalbumin / creatinine urine ratio  Result Value Ref Range   Microalb, Ur <0.7 0.0 - 1.9 mg/dL   Creatinine,U 694.8 mg/dL   Microalb Creat Ratio 0.6 0.0 - 30.0 mg/g       Assessment & Plan:  Type 2 diabetes mellitus without complication, without long-term current use of insulin (HCC)    Return in about 6 months (around 05/11/2023) for DM.   I,Rachel Rivera,acting as a scribe for Angelena Sole, MD.,have documented all relevant documentation on the behalf of Angelena Sole, MD,as directed by  Angelena Sole, MD while in the presence of Angelena Sole, MD.  I, Isabelle Course, have reviewed all documentation for this visit. The documentation on 11/11/22 for the exam, diagnosis, procedures,  and orders are all accurate and complete.  *** Isabelle Course

## 2022-11-11 ENCOUNTER — Ambulatory Visit: Payer: BC Managed Care – PPO | Admitting: Family Medicine

## 2022-11-11 ENCOUNTER — Encounter: Payer: Self-pay | Admitting: Family Medicine

## 2022-11-11 VITALS — BP 128/82 | HR 70 | Temp 97.7°F | Ht 66.0 in | Wt 234.2 lb

## 2022-11-11 DIAGNOSIS — E119 Type 2 diabetes mellitus without complications: Secondary | ICD-10-CM | POA: Diagnosis not present

## 2022-11-11 DIAGNOSIS — E781 Pure hyperglyceridemia: Secondary | ICD-10-CM | POA: Diagnosis not present

## 2022-11-11 DIAGNOSIS — J069 Acute upper respiratory infection, unspecified: Secondary | ICD-10-CM

## 2022-11-13 NOTE — Assessment & Plan Note (Signed)
Chronic.  Well-controlled.  Continue diet/exercise.  Will get a copy of eye exam.

## 2022-11-13 NOTE — Assessment & Plan Note (Signed)
Chronic.  Well-controlled with diet/exercise.  Continue.

## 2023-01-09 DIAGNOSIS — H401111 Primary open-angle glaucoma, right eye, mild stage: Secondary | ICD-10-CM | POA: Diagnosis not present

## 2023-01-09 DIAGNOSIS — H401122 Primary open-angle glaucoma, left eye, moderate stage: Secondary | ICD-10-CM | POA: Diagnosis not present

## 2023-01-26 DIAGNOSIS — M542 Cervicalgia: Secondary | ICD-10-CM | POA: Diagnosis not present

## 2023-01-26 DIAGNOSIS — M9902 Segmental and somatic dysfunction of thoracic region: Secondary | ICD-10-CM | POA: Diagnosis not present

## 2023-01-26 DIAGNOSIS — M9901 Segmental and somatic dysfunction of cervical region: Secondary | ICD-10-CM | POA: Diagnosis not present

## 2023-01-26 DIAGNOSIS — M62838 Other muscle spasm: Secondary | ICD-10-CM | POA: Diagnosis not present

## 2023-03-30 DIAGNOSIS — H401122 Primary open-angle glaucoma, left eye, moderate stage: Secondary | ICD-10-CM | POA: Diagnosis not present

## 2023-03-30 DIAGNOSIS — H401111 Primary open-angle glaucoma, right eye, mild stage: Secondary | ICD-10-CM | POA: Diagnosis not present

## 2023-05-23 ENCOUNTER — Other Ambulatory Visit: Payer: Self-pay | Admitting: Family Medicine

## 2023-05-23 DIAGNOSIS — Z1231 Encounter for screening mammogram for malignant neoplasm of breast: Secondary | ICD-10-CM

## 2023-06-01 ENCOUNTER — Encounter: Payer: Self-pay | Admitting: Family Medicine

## 2023-06-01 ENCOUNTER — Ambulatory Visit: Admitting: Family Medicine

## 2023-06-01 VITALS — BP 115/76 | HR 76 | Temp 97.2°F | Resp 18 | Ht 66.0 in | Wt 235.1 lb

## 2023-06-01 DIAGNOSIS — Z7184 Encounter for health counseling related to travel: Secondary | ICD-10-CM | POA: Diagnosis not present

## 2023-06-01 DIAGNOSIS — E119 Type 2 diabetes mellitus without complications: Secondary | ICD-10-CM

## 2023-06-01 DIAGNOSIS — E781 Pure hyperglyceridemia: Secondary | ICD-10-CM

## 2023-06-01 MED ORDER — AMOXICILLIN 500 MG PO CAPS: 500.0000 mg | ORAL_CAPSULE | Freq: Three times a day (TID) | ORAL | 0 refills | Status: AC

## 2023-06-01 MED ORDER — AZITHROMYCIN 250 MG PO TABS: 500.0000 mg | ORAL_TABLET | Freq: Every day | ORAL | 0 refills | Status: DC

## 2023-06-01 MED ORDER — MEFLOQUINE HCL 250 MG PO TABS: 250.0000 mg | ORAL_TABLET | ORAL | 1 refills | Status: DC

## 2023-06-01 MED ORDER — TYPHOID VACCINE PO CPDR: 1.0000 | DELAYED_RELEASE_CAPSULE | ORAL | 0 refills | Status: DC

## 2023-06-01 NOTE — Progress Notes (Signed)
 Subjective:     Patient ID: Sheri Liu, female    DOB: November 22, 1955, 68 y.o.   MRN: [id number]  Chief Complaint  Patient presents with   New Med Request    Going to [place], need rx's    HPI DM, type 2 - Maintaining healthy diet and consistently exercise. Weight training and walking. Has trainer. States her goal weight is around 170 lbs. She tends to count her calories using her fitness app. Enjoys eating nuts (cashews, macadamia).  Really more glucose impaired now.   Hypercholesterolemia - Managing with healthy diet and exercise. Glaucoma-seeing ophth at least every 6 months 4  needs for travel-malaria-the [place] one, t Discussed the use of AI scribe software for clinical note transcription with the patient, who gave verbal consent to proceed History of Present Illness Sheri Liu is a 68 year old female who presents for a routine six-month follow-up.  She is managing her diabetes through diet and exercise and continues to work with a Psychologist, educational. She has not been checking her blood sugars.  Her hyperlipidemia is being managed with diet and exercise. She is unaware of her current cholesterol levels but believes she is doing well with lifestyle modifications.  For her glaucoma, she continues to see her eye doctor every six months. She has a cataract consultation scheduled and is considering cataract surgery, which may impact her glaucoma management.  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 She is preparing for the trip by ensuring she has the necessary medications, including mefloquine for malaria and typhoid pills. She has previously received the yellow fever vaccine and is aware of the requirements for travel.  She has a history of dental issues, specifically with her gums, and is concerned about potential flare-ups during her trip. She typically takes antibiotics for her gums when traveling to prevent issues.  No  headaches, dizziness, chest pain, shortness of breath, swelling in her legs, vomiting, or diarrhea. She mentions a plateau in her weight loss efforts despite maintaining her exercise routine.    Health Maintenance Due  Topic Date Due   HEMOGLOBIN A1C  01/22/2023   FOOT EXAM  03/12/2023    Past Medical History:  Diagnosis Date   Diabetes mellitus without complication (HCC) 01/21/2013   hx well controlled. A1c 05/2020 6.3% with [facility] Alzheimer's prevention trial data/labs   Glaucoma 11-13   Hypercholesterolemia 01/2019   recommended statin 01/2019   Obesity, Class III, BMI 40-49.9 (morbid obesity) (HCC)    Going to [facility] to do their wt loss program as of 02/2013   Tinnitus 06/14/2011   transient    Past Surgical History:  Procedure Laterality Date   ABDOMINAL HYSTERECTOMY  1996   partial- still has ovaries, for fibroids, heavy bleeding   COLONOSCOPY  approx 2009   in [place]: pt reports that it was NORMAL   COLONOSCOPY  08/2016   NORMAL per pt   KNEE ARTHROSCOPY Left 1989   left, arthroscopy to clean up meniscus     Current Outpatient Medications:    albuterol (VENTOLIN HFA) 108 (90 Base) MCG/ACT inhaler, Inhale 2 puffs into the lungs every 6 (six) hours as needed for wheezing or shortness of breath., Disp: 8 g, Rfl: 1   brimonidine (ALPHAGAN) 0.2 % ophthalmic solution, Apply to eye., Disp: , Rfl:    dorzolamide-timolol (COSOPT) 22.3-6.8 MG/ML ophthalmic solution, INSTILL 1 DROP INTO BOTH EYES TWICE A DAY**PLEASE CALL  FOR APPOINTMENT**, Disp: , Rfl:    LACTOBACILLUS PO, [facility] probiotics daily by Schiff: 1 capsule daily, Disp: , Rfl:    latanoprost (XALATAN) 0.005 % ophthalmic solution, Place 1 drop into both eyes nightly., Disp: , Rfl:   No Known Allergies ROS neg/noncontributory except as noted HPI/below      Objective:     BP 115/76   Pulse 76   Temp (!) 97.2 F (36.2 C) (Temporal)   Resp 18   Ht 5\' 6"  (1.676 m)   Wt 235 lb 2 oz (106.7 kg)    SpO2 100%   BMI 37.95 kg/m  Wt Readings from Last 3 Encounters:  06/01/23 235 lb 2 oz (106.7 kg)  11/11/22 234 lb 3.2 oz (106.2 kg)  07/22/22 238 lb 9.6 oz (108.2 kg)    Physical Exam   Gen: WDWN NAD HEENT: NCAT, conjunctiva not injected, sclera nonicteric NECK:  supple, no thyromegaly, no nodes, no carotid bruits CARDIAC: RRR, S1S2+, no murmur. DP 2+B LUNGS: CTAB. No wheezes ABDOMEN:  BS+, soft, NTND, No HSM, no masses EXT:  no edema MSK: no gross abnormalities.  NEURO: A&O x3.  CN II-XII intact.  PSYCH: normal mood. Good eye contact     Assessment & Plan:  Type 2 diabetes mellitus without complication, without long-term current use of insulin (HCC) -     CBC with Differential/Platelet; Future -     Comprehensive metabolic panel with GFR; Future -     Hemoglobin A1c; Future -     Lipid panel; Future -     Microalbumin / creatinine urine ratio; Future  Hypertriglyceridemia -     Lipid panel; Future  Assessment and Plan Assessment & Plan Travel Medicine   She is preparing for travel to [place] and requires prophylaxis for malaria and typhoid. Prefers the oral typhoid vaccine despite potential side effects. Azithromycin is discussed for traveler's diarrhea, and Pepto-Bismol is advised for prevention, with possible side effects including black stools and tongue coating. Amoxicillin is considered for dental issues if needed, with caution due to the risk of antibiotic-associated diarrhea. Prescribe mefloquine for malaria prophylaxis, oral typhoid vaccine, and azithromycin for traveler's diarrhea if needed. Advise taking Pepto-Bismol tablets daily to prevent diarrhea. Prescribe amoxicillin for dental issues if necessary.  Type 2 Diabetes Mellitus   She manages diabetes with diet and exercise but has not reported recent blood glucose monitoring. continue the current diet and exercise regimen. Return for labs(pt in a rush  Hyperlipidemia   Her cholesterol levels are well-managed  with diet and exercise, requiring ongoing monitoring. Continue the current diet and exercise regimen.  Glaucoma   She is under regular ophthalmologic care with visits every six months. An upcoming cataract consultation may impact glaucoma management. Continue regular ophthalmologic follow-ups and proceed with the cataract consultation.    Return in about 6 months (around 12/02/2023) for chronic follow-up.  labs soon.  Angelena Sole, MD

## 2023-06-01 NOTE — Patient Instructions (Addendum)
 Pepto bismol tablet daily to prevent diarrhea. Amoxicilloin for teeth if needed.  Start the vivotif for typhoid asap as can't be on antibiotics when take it  There may be a shortage of mefloquine-if can't get, will change to another  Yellow fever good for life

## 2023-06-02 ENCOUNTER — Ambulatory Visit

## 2023-06-09 ENCOUNTER — Ambulatory Visit
Admission: RE | Admit: 2023-06-09 | Discharge: 2023-06-09 | Disposition: A | Discharge status: Home / Self Care | Source: Ambulatory Visit | Attending: Family Medicine | Admitting: Family Medicine

## 2023-06-09 DIAGNOSIS — Z1231 Encounter for screening mammogram for malignant neoplasm of breast: Secondary | ICD-10-CM | POA: Diagnosis not present

## 2023-06-14 ENCOUNTER — Other Ambulatory Visit: Payer: Self-pay | Admitting: Family Medicine

## 2023-06-14 MED ORDER — DOXYCYCLINE HYCLATE 100 MG PO TABS: 100.0000 mg | ORAL_TABLET | Freq: Every day | ORAL | 0 refills | Status: DC

## 2023-06-15 ENCOUNTER — Encounter: Payer: Self-pay | Admitting: Family Medicine

## 2023-08-01 DIAGNOSIS — H401122 Primary open-angle glaucoma, left eye, moderate stage: Secondary | ICD-10-CM | POA: Diagnosis not present

## 2023-08-01 DIAGNOSIS — H2513 Age-related nuclear cataract, bilateral: Secondary | ICD-10-CM | POA: Diagnosis not present

## 2023-08-01 DIAGNOSIS — H04123 Dry eye syndrome of bilateral lacrimal glands: Secondary | ICD-10-CM | POA: Diagnosis not present

## 2023-08-01 DIAGNOSIS — Z83511 Family history of glaucoma: Secondary | ICD-10-CM | POA: Diagnosis not present

## 2023-08-01 DIAGNOSIS — H401111 Primary open-angle glaucoma, right eye, mild stage: Secondary | ICD-10-CM | POA: Diagnosis not present

## 2023-08-11 ENCOUNTER — Other Ambulatory Visit

## 2023-08-18 ENCOUNTER — Other Ambulatory Visit

## 2023-09-25 DIAGNOSIS — Z789 Other specified health status: Secondary | ICD-10-CM | POA: Diagnosis not present

## 2023-09-25 DIAGNOSIS — E119 Type 2 diabetes mellitus without complications: Secondary | ICD-10-CM | POA: Diagnosis not present

## 2023-11-30 DIAGNOSIS — H401111 Primary open-angle glaucoma, right eye, mild stage: Secondary | ICD-10-CM | POA: Diagnosis not present

## 2023-11-30 DIAGNOSIS — H401122 Primary open-angle glaucoma, left eye, moderate stage: Secondary | ICD-10-CM | POA: Diagnosis not present

## 2023-12-01 ENCOUNTER — Other Ambulatory Visit (INDEPENDENT_AMBULATORY_CARE_PROVIDER_SITE_OTHER)

## 2023-12-01 DIAGNOSIS — E119 Type 2 diabetes mellitus without complications: Secondary | ICD-10-CM | POA: Diagnosis not present

## 2023-12-01 DIAGNOSIS — E781 Pure hyperglyceridemia: Secondary | ICD-10-CM

## 2023-12-01 LAB — COMPREHENSIVE METABOLIC PANEL WITH GFR
ALT: 13 U/L (ref 0–35)
AST: 14 U/L (ref 0–37)
Albumin: 4.2 g/dL (ref 3.5–5.2)
Alkaline Phosphatase: 64 U/L (ref 39–117)
BUN: 17 mg/dL (ref 6–23)
CO2: 29 meq/L (ref 19–32)
Calcium: 9.4 mg/dL (ref 8.4–10.5)
Chloride: 107 meq/L (ref 96–112)
Creatinine, Ser: 0.93 mg/dL (ref 0.40–1.20)
GFR: 62.33 mL/min (ref >60.00)
Glucose, Bld: 104 mg/dL — ABNORMAL HIGH (ref 70–99)
Potassium: 4.1 meq/L (ref 3.5–5.1)
Sodium: 141 meq/L (ref 135–145)
Total Bilirubin: 0.4 mg/dL (ref 0.2–1.2)
Total Protein: 7.6 g/dL (ref 6.0–8.3)

## 2023-12-01 LAB — LIPID PANEL
Cholesterol: 179 mg/dL (ref 0–200)
HDL: 65.5 mg/dL (ref >39.00)
LDL Cholesterol: 98 mg/dL (ref 0–99)
NonHDL: 113.44
Total CHOL/HDL Ratio: 3
Triglycerides: 78 mg/dL (ref 0.0–149.0)
VLDL: 15.6 mg/dL (ref 0.0–40.0)

## 2023-12-01 LAB — CBC WITH DIFFERENTIAL/PLATELET
Basophils Absolute: 0 K/uL (ref 0.0–0.1)
Basophils Relative: 0.7 % (ref 0.0–3.0)
Eosinophils Absolute: 0.1 K/uL (ref 0.0–0.7)
Eosinophils Relative: 2.8 % (ref 0.0–5.0)
HCT: 40.1 % (ref 36.0–46.0)
Hemoglobin: 13 g/dL (ref 12.0–15.0)
Lymphocytes Relative: 47.9 % — ABNORMAL HIGH (ref 12.0–46.0)
Lymphs Abs: 2.5 K/uL (ref 0.7–4.0)
MCHC: 32.4 g/dL (ref 30.0–36.0)
MCV: 91 fl (ref 78.0–100.0)
Monocytes Absolute: 0.4 K/uL (ref 0.1–1.0)
Monocytes Relative: 8.1 % (ref 3.0–12.0)
Neutro Abs: 2.1 K/uL (ref 1.4–7.7)
Neutrophils Relative %: 40.5 % — ABNORMAL LOW (ref 43.0–77.0)
Platelets: 196 K/uL (ref 150.0–400.0)
RBC: 4.41 Mil/uL (ref 3.87–5.11)
RDW: 14.1 % (ref 11.5–15.5)
WBC: 5.2 K/uL (ref 4.0–10.5)

## 2023-12-01 LAB — MICROALBUMIN / CREATININE URINE RATIO
Creatinine,U: 163.6 mg/dL
Microalb Creat Ratio: 7.3 mg/g (ref 0.0–30.0)
Microalb, Ur: 1.2 mg/dL (ref 0.0–1.9)

## 2023-12-01 LAB — HEMOGLOBIN A1C: Hgb A1c MFr Bld: 6.6 % — ABNORMAL HIGH (ref 4.6–6.5)

## 2023-12-03 ENCOUNTER — Ambulatory Visit: Payer: Self-pay | Admitting: Family Medicine

## 2023-12-03 NOTE — Progress Notes (Signed)
 Looking good.  Will discuss at appt.  A1C has increased some though

## 2023-12-08 ENCOUNTER — Encounter: Payer: Self-pay | Admitting: Family Medicine

## 2023-12-08 ENCOUNTER — Ambulatory Visit: Admitting: Family Medicine

## 2023-12-08 VITALS — BP 130/78 | HR 75 | Temp 97.9°F | Ht 66.0 in | Wt 252.0 lb

## 2023-12-08 DIAGNOSIS — Z23 Encounter for immunization: Secondary | ICD-10-CM | POA: Diagnosis not present

## 2023-12-08 DIAGNOSIS — E119 Type 2 diabetes mellitus without complications: Secondary | ICD-10-CM

## 2023-12-08 DIAGNOSIS — Z6841 Body Mass Index (BMI) 40.0 and over, adult: Secondary | ICD-10-CM

## 2023-12-08 DIAGNOSIS — Z7985 Long-term (current) use of injectable non-insulin antidiabetic drugs: Secondary | ICD-10-CM | POA: Diagnosis not present

## 2023-12-08 DIAGNOSIS — E781 Pure hyperglyceridemia: Secondary | ICD-10-CM

## 2023-12-08 MED ORDER — TIRZEPATIDE 2.5 MG/0.5ML ~~LOC~~ SOAJ: 2.5000 mg | SUBCUTANEOUS | 0 refills | Status: DC

## 2023-12-08 NOTE — Progress Notes (Signed)
 Subjective:     Patient ID: Sheri Liu, female    DOB: October 19, 1955, 68 y.o.   MRN: [id number]  Chief Complaint  Patient presents with   Diabetes    49mo follow up; doing alright    HPI Discussed the use of AI scribe software for clinical note transcription with the patient, who gave verbal consent to proceed.  History of Present Illness Sheri Liu is a 68 year old female with diabetes and glaucoma who presents for follow-up on her diabetes and cholesterol management.  She has gained 17 pounds, attributing this to falling off her previous dietary regimen following the death of three aunts and the cessation of a strict 12-step program. She is concerned about her current weight and its impact on her health, particularly her diabetes and arthritis. She exercises regularly but struggles with maintaining a healthy diet, finding it difficult to control cravings and sustain a low-calorie diet. She is not consuming fried foods or sugar regularly but has had occasional lapses.  Her diabetes management is a primary concern. Her A1c was 5.9% in July but has increased to 6.6% recently. She feels lethargic and describes her condition as 'full-fledged diabetic.' She has not been on any diabetes medications recently.  She has a history of glaucoma for which she uses brimonidine (Alphagan) and latanoprost eye drops. She confirms that she is not using Cosopt (dorzolamide, timolol) anymore. She reports that her eye doctor said things are great at her recent eye examination.  She is not currently using albuterol  or probiotics. She takes a multivitamin infusion regularly and her typhoid vaccination is up to date for five years.  No chest pain, heart racing, coughing, shortness of breath, vomiting, diarrhea, constipation, or depression requiring treatment. She is, however, 'depressed about my weight.'    Health Maintenance Due  Topic Date Due   COVID-19 Vaccine (8 - 2025-26 season)  11/06/2023    Past Medical History:  Diagnosis Date   Diabetes mellitus without complication (HCC) 01/21/2013   hx well controlled. A1c 05/2020 6.3% with [facility] Alzheimer's prevention trial data/labs   Glaucoma 11-13   Hypercholesterolemia 01/2019   recommended statin 01/2019   Obesity, Class III, BMI 40-49.9 (morbid obesity) (HCC)    Going to [facility] to do their wt loss program as of 02/2013   Tinnitus 06/14/2011   transient    Past Surgical History:  Procedure Laterality Date   ABDOMINAL HYSTERECTOMY  1996   partial- still has ovaries, for fibroids, heavy bleeding   COLONOSCOPY  approx 2009   in [place] : pt reports that it was NORMAL   COLONOSCOPY  08/2016   NORMAL per pt   KNEE ARTHROSCOPY Left 1989   left, arthroscopy to clean up meniscus     Current Outpatient Medications:    brimonidine (ALPHAGAN) 0.2 % ophthalmic solution, Apply to eye., Disp: , Rfl:    latanoprost (XALATAN) 0.005 % ophthalmic solution, Place 1 drop into both eyes nightly., Disp: , Rfl:    Multiple Vitamin (MULTIVITAMIN WITH MINERALS) TABS tablet, Take 1 tablet by mouth daily., Disp: , Rfl:    tirzepatide  (MOUNJARO ) 2.5 MG/0.5ML Pen, Inject 2.5 mg into the skin once a week., Disp: 2 mL, Rfl: 0  No Known Allergies ROS neg/noncontributory except as noted HPI/below      Objective:     BP 130/78   Pulse 75   Temp 97.9 F (36.6 C)   Ht 5' 6 (1.676 m)   Wt 252 lb (114.3 kg)  SpO2 98%   BMI 40.67 kg/m  Wt Readings from Last 3 Encounters:  12/08/23 252 lb (114.3 kg)  06/01/23 235 lb 2 oz (106.7 kg)  11/11/22 234 lb 3.2 oz (106.2 kg)    Physical Exam   Gen: WDWN NAD HEENT: NCAT, conjunctiva not injected, sclera nonicteric NECK:  supple, no thyromegaly, no nodes, no carotid bruits CARDIAC: RRR, S1S2+, no murmur. DP 2+B LUNGS: CTAB. No wheezes ABDOMEN:  BS+, soft, NTND, No HSM, no masses EXT:  no edema MSK: no gross abnormalities.  NEURO: A&O x3.  CN II-XII intact.  PSYCH:  normal mood. Good eye contact  Diabetic Foot Exam - Simple   Simple Foot Form Diabetic Foot exam was performed with the following findings: Yes 12/08/2023  9:14 AM  Visual Inspection See comments: Yes Sensation Testing Intact to touch and monofilament testing bilaterally: Yes Pulse Check Posterior Tibialis and Dorsalis pulse intact bilaterally: Yes Comments bunions         Assessment & Plan:  Type 2 diabetes mellitus without complication, without long-term current use of insulin (HCC) -     Tirzepatide ; Inject 2.5 mg into the skin once a week.  Dispense: 2 mL; Refill: 0  Hypertriglyceridemia  Flu vaccine need -     Flu vaccine HIGH DOSE PF(Fluzone Trivalent)  Need for Tdap vaccination -     Tdap vaccine greater than or equal to 7yo IM  Morbid obesity with BMI of 40.0-44.9, adult (HCC)  Assessment and Plan Assessment & Plan Type 2 diabetes mellitus   Type 2 diabetes mellitus with an A1c of 6.6% indicates poor glycemic control, slightly increased from 6.5% in 2023. Will start mounjaro  2.5mg  weekly and increase to 5mg  in 1 month Educate on potential side effects of medications, including constipation and diarrhea. Advise on dietary modifications to improve glycemic control, focusing on balanced meals with proteins, vegetables, and limited carbohydrates.  Obesity   Obesity with a recent weight gain of 17 pounds raises concerns about maintaining weight loss with dietary changes alone. Discuss the potential use of Mounjaro  for weight management and insurance considerations. Submit prior authorization for Mounjaro  to insurance for weight management. Discuss potential insurance requirements for trying other medications like Ozempic  or Trulicity . Educate on dietary modifications to support weight loss, emphasizing balanced meals and portion control. Encourage regular physical activity to aid in weight management.  Glaucoma   Long-standing glaucoma is well-managed with current  medications. A recent ophthalmology visit confirmed the condition is stable. Continue the current eye drop regimen with brimonidine and latanoprost. Discontinue dorzolamide/timolol as it is not currently used.  Hyperlipidemia   Continue monitoring cholesterol levels.    Return in about 3 months (around 03/09/2024) for DM.  Jenkins CHRISTELLA Carrel, MD

## 2023-12-08 NOTE — Patient Instructions (Signed)

## 2024-01-07 ENCOUNTER — Other Ambulatory Visit: Payer: Self-pay | Admitting: Family Medicine

## 2024-01-07 DIAGNOSIS — E119 Type 2 diabetes mellitus without complications: Secondary | ICD-10-CM

## 2024-01-07 MED ORDER — TIRZEPATIDE 5 MG/0.5ML ~~LOC~~ SOAJ: 5.0000 mg | SUBCUTANEOUS | 0 refills | Status: DC

## 2024-01-09 ENCOUNTER — Telehealth: Payer: Self-pay

## 2024-01-09 ENCOUNTER — Other Ambulatory Visit (HOSPITAL_COMMUNITY): Payer: Self-pay

## 2024-01-09 NOTE — Telephone Encounter (Signed)
 Copied from CRM [id number]. Topic: Referral - Prior Authorization Question >> Jan 08, 2024  3:01 PM Paige D wrote: Reason for CRM: Pt is calling in regards to a prescription that needs a prior auth for tirzepatide  (MOUNJARO ) 5 MG/0.5ML Pen. Please reach out to pt with status update. Call back # [phone] >> Jan 09, 2024  9:54 AM Willma SAUNDERS wrote: Patient calling again to check on the status, is calling again to check on.

## 2024-01-09 NOTE — Telephone Encounter (Signed)
 Pharmacy Patient Advocate Encounter   Received notification from Onbase that prior authorization for Mounjaro  5MG /0.5ML auto-injectors is required/requested.   Insurance verification completed.   The patient is insured through [facility].   Per test claim: PA required; PA submitted to above mentioned insurance via Latent Key/confirmation #/EOC [id number] Status is pending

## 2024-01-09 NOTE — Telephone Encounter (Signed)
 Pharmacy Patient Advocate Encounter  Received notification from [facility] that Prior Authorization for Mounjaro  5MG /0.5ML auto-injectors  has been APPROVED from 01/09/24 to 01/09/27   PA #/Case ID/Reference #: [id number]

## 2024-01-18 NOTE — Progress Notes (Signed)
 Sheri Liu                                          MRN: [id number]   01/18/2024   The [facility] Quality Team Specialist reviewed this patient medical record for the purposes of chart review for care gap closure. The following were reviewed: abstraction for care gap closure-kidney health evaluation for diabetes:eGFR  and uACR.    [facility] Quality Team

## 2024-01-25 DIAGNOSIS — G5601 Carpal tunnel syndrome, right upper limb: Secondary | ICD-10-CM | POA: Diagnosis not present

## 2024-01-26 ENCOUNTER — Telehealth: Payer: Self-pay

## 2024-01-26 NOTE — Telephone Encounter (Signed)
 Copied from CRM #[id number]. Topic: Clinical - Medication Prior Auth >> Jan 26, 2024  1:42 PM Franky GRADE wrote: Reason for CRM: Patient is calling because her pharmacy informed her that they have not gotten the PA for tirzepatide  (MOUNJARO ) 5 MG/0.5ML Pen [[id number]]. She received a letter that it has been approved and doesn't understand why they haven't gotten it yet.  Can we rub a PA on this Rx  Thank you,  Avelina Situ

## 2024-01-29 ENCOUNTER — Other Ambulatory Visit (HOSPITAL_COMMUNITY): Payer: Self-pay

## 2024-02-04 ENCOUNTER — Other Ambulatory Visit: Payer: Self-pay | Admitting: Family Medicine

## 2024-02-05 ENCOUNTER — Other Ambulatory Visit: Payer: Self-pay

## 2024-02-05 MED ORDER — TIRZEPATIDE 5 MG/0.5ML ~~LOC~~ SOAJ: 5.0000 mg | SUBCUTANEOUS | 0 refills | Status: DC

## 2024-02-05 NOTE — Telephone Encounter (Unsigned)
 Copied from CRM [id number]. Topic: Clinical - Prescription Issue >> Feb 05, 2024 10:58 AM Berneda FALCON wrote: Reason for CRM: Patient states that at first the pharmacy needed the prior auth, but then got that and now they need a new RX for this medication from PCP.  Medication:tirzepatide  (MOUNJARO ) 5 MG/0.5ML Pen  [id number] [place], [place] - [address] AT CORNER OF [facility] [address] [place] [address] Phone: [phone] Fax: [phone] Hours: Not open 24 hours

## 2024-02-22 DIAGNOSIS — M5032 Other cervical disc degeneration, mid-cervical region, unspecified level: Secondary | ICD-10-CM | POA: Diagnosis not present

## 2024-02-22 DIAGNOSIS — M9902 Segmental and somatic dysfunction of thoracic region: Secondary | ICD-10-CM | POA: Diagnosis not present

## 2024-02-22 DIAGNOSIS — M47812 Spondylosis without myelopathy or radiculopathy, cervical region: Secondary | ICD-10-CM | POA: Diagnosis not present

## 2024-02-22 DIAGNOSIS — M9901 Segmental and somatic dysfunction of cervical region: Secondary | ICD-10-CM | POA: Diagnosis not present

## 2024-02-26 DIAGNOSIS — M9901 Segmental and somatic dysfunction of cervical region: Secondary | ICD-10-CM | POA: Diagnosis not present

## 2024-02-26 DIAGNOSIS — M47812 Spondylosis without myelopathy or radiculopathy, cervical region: Secondary | ICD-10-CM | POA: Diagnosis not present

## 2024-02-26 DIAGNOSIS — M9902 Segmental and somatic dysfunction of thoracic region: Secondary | ICD-10-CM | POA: Diagnosis not present

## 2024-02-26 DIAGNOSIS — M5032 Other cervical disc degeneration, mid-cervical region, unspecified level: Secondary | ICD-10-CM | POA: Diagnosis not present

## 2024-02-27 ENCOUNTER — Other Ambulatory Visit: Payer: Self-pay | Admitting: Family Medicine

## 2024-02-27 MED ORDER — TIRZEPATIDE 5 MG/0.5ML ~~LOC~~ SOAJ: 5.0000 mg | SUBCUTANEOUS | 0 refills | Status: DC

## 2024-02-27 NOTE — Telephone Encounter (Signed)
 Copied from CRM #[id number]. Topic: Clinical - Medication Refill >> Feb 27, 2024  1:58 PM Sheri Liu wrote: Medication: tirzepatide  (MOUNJARO ) 5 MG/0.5ML Pen  Has the patient contacted their pharmacy? Yes (Agent: If no, request that the patient contact the pharmacy for the refill. If patient does not wish to contact the pharmacy document the reason why and proceed with request.) (Agent: If yes, when and what did the pharmacy advise?)  This is the patient's preferred pharmacy:  [id number] [place], [place] - [address] AT [address]NER OF [facility] [address] [place] [address] Phone: [phone] Fax: [phone]  Is this the correct pharmacy for this prescription? Yes If no, delete pharmacy and type the correct one.   Has the prescription been filled recently? Yes  Is the patient out of the medication? Yes  Has the patient been seen for an appointment in the last year OR does the patient have an upcoming appointment? Yes  Can we respond through MyChart? Yes  Agent: Please be advised that Rx refills may take up to 3 business days. We ask that you follow-up with your pharmacy.

## 2024-02-27 NOTE — Telephone Encounter (Signed)
 Needs appt

## 2024-03-22 ENCOUNTER — Ambulatory Visit: Admitting: Family Medicine

## 2024-03-22 ENCOUNTER — Encounter: Payer: Self-pay | Admitting: Family Medicine

## 2024-03-22 VITALS — BP 118/74 | HR 80 | Temp 97.0°F | Ht 66.0 in | Wt 240.1 lb

## 2024-03-22 DIAGNOSIS — E119 Type 2 diabetes mellitus without complications: Secondary | ICD-10-CM

## 2024-03-22 DIAGNOSIS — H401130 Primary open-angle glaucoma, bilateral, stage unspecified: Secondary | ICD-10-CM

## 2024-03-22 DIAGNOSIS — Z7985 Long-term (current) use of injectable non-insulin antidiabetic drugs: Secondary | ICD-10-CM | POA: Diagnosis not present

## 2024-03-22 LAB — HEMOGLOBIN A1C: Hgb A1c MFr Bld: 6.2 % (ref 4.6–6.5)

## 2024-03-22 MED ORDER — TIRZEPATIDE 7.5 MG/0.5ML ~~LOC~~ SOAJ: 7.5000 mg | SUBCUTANEOUS | 2 refills | Status: AC

## 2024-03-22 NOTE — Patient Instructions (Signed)
 It was very nice to see you today!  Increase mounjaro  to 7.5mg  weekly   PLEASE NOTE:  If you had any lab tests please let us  know if you have not heard back within a few days. You may see your results on MyChart before we have a chance to review them but we will give you a call once they are reviewed by us . If we ordered any referrals today, please let us  know if you have not heard from their office within the next week.   Please try these tips to maintain a healthy lifestyle:  Eat most of your calories during the day when you are active. Eliminate processed foods including packaged sweets (pies, cakes, cookies), reduce intake of potatoes, white bread, white pasta, and white rice. Look for whole grain options, oat flour or almond flour.  Each meal should contain half fruits/vegetables, one quarter protein, and one quarter carbs (no bigger than a computer mouse).  Cut down on sweet beverages. This includes juice, soda, and sweet tea. Also watch fruit intake, though this is a healthier sweet option, it still contains natural sugar! Limit to 3 servings daily.  Drink at least 1 glass of water with each meal and aim for at least 8 glasses per day  Exercise at least 150 minutes every week.

## 2024-03-22 NOTE — Progress Notes (Signed)
 "  Subjective:     Patient ID: Sheri Liu, female    DOB: 11-29-1955, 69 y.o.   MRN: [id number]  Chief Complaint  Patient presents with   Diabetes    Pt is here for chronic issues    Discussed the use of AI scribe software for clinical note transcription with the patient, who gave verbal consent to proceed.  History of Present Illness Sheri Liu is a 69 year old female with diabetes who presents for follow-up regarding her diabetes management and medication adjustment.  She is currently taking Mounjaro  5 mg for her diabetes, which she finds effective. However, she experiences heartburn when consuming spicy foods. No nausea, vomiting, or constipation. She has lost twelve pounds over the past three months and is five pounds away from her weight a year ago.  Food noise coming back  She has moderate glaucoma in both eyes, with occasional pressure increases but generally stable. She is concerned about the potential impact of Mounjaro  on her glaucoma and plans to discuss this with her ophthalmologist.  Her last comprehensive blood work in September included liver, kidney, and cholesterol tests.  She discusses her weight loss journey, noting her previous experience with a health program and her current regimen of eating small amounts of appropriate foods. Mounjaro  aids in appetite management, and she maintains muscle mass through weightlifting.  She shares that her sister, who is not on Mounjaro , has found it helpful for sleep apnea. She is aware of the medication's potential benefits beyond diabetes management, including for conditions like heart disease and Alzheimer's.    There are no preventive care reminders to display for this patient.   Past Medical History:  Diagnosis Date   Diabetes mellitus without complication (HCC) 01/21/2013   hx well controlled. A1c 05/2020 6.3% with [facility] Alzheimer's prevention trial data/labs   Glaucoma 11-13   Hypercholesterolemia  01/2019   recommended statin 01/2019   Obesity, Class III, BMI 40-49.9 (morbid obesity) (HCC)    Going to [facility] to do their wt loss program as of 02/2013   Tinnitus 06/14/2011   transient    Past Surgical History:  Procedure Laterality Date   ABDOMINAL HYSTERECTOMY  1996   partial- still has ovaries, for fibroids, heavy bleeding   COLONOSCOPY  approx 2009   in [place] : pt reports that it was NORMAL   COLONOSCOPY  08/2016   NORMAL per pt   KNEE ARTHROSCOPY Left 1989   left, arthroscopy to clean up meniscus    Current Medications[1]  Allergies[2] ROS neg/noncontributory except as noted HPI/below      Objective:     BP 118/74 (BP Location: Left Arm, Patient Position: Sitting, Cuff Size: Large)   Pulse 80   Temp (!) 97 F (36.1 C) (Temporal)   Ht 5' 6 (1.676 m)   Wt 240 lb 2 oz (108.9 kg)   SpO2 98%   BMI 38.76 kg/m  Wt Readings from Last 3 Encounters:  03/22/24 240 lb 2 oz (108.9 kg)  12/08/23 252 lb (114.3 kg)  06/01/23 235 lb 2 oz (106.7 kg)    Physical Exam GENERAL: Well developed well nourished no acute distress HEAD EYES EARS NOSE THROAT: Normocephalic atraumatic, conjunctiva not injected, sclera nonicteric CARDIAC: Regular rate and rhythm, S1 S2 present , no murmur, dorsalis pedis 2 plus bilaterally NECK: Supple, no thyromegaly, no nodes, no carotid bruits LUNGS: Clear to auscultation bilaterally, no wheezes ABDOMEN: Bowel sounds present, soft, non tender non distended, no hepatosplenomegaly, no masses  EXTREMITIES: No edema MUSCULOSKELETAL: No gross abnormalities NEUROLOGICAL: Alert and oriented x3, cranial nerves II through XII intact PSYCHIATRIC: Normal mood, good eye contact       Assessment & Plan:  Type 2 diabetes mellitus without complication, without long-term current use of insulin (HCC) -     Hemoglobin A1c -     Tirzepatide ; Inject 7.5 mg into the skin once a week.  Dispense: 2 mL; Refill: 2  Long-term current use of injectable  noninsulin antidiabetic medication  Primary open angle glaucoma of both eyes, unspecified glaucoma stage    Assessment and Plan Assessment & Plan Type 2 diabetes mellitus   Managed with Mounjaro  5 mg. She reports occasional heartburn after eating spicy foods, but no nausea, vomiting, or constipation. She lost 12 pounds over three months, indicating effective weight management. Considering increasing Mounjaro  to 7.5 mg due to increased appetite and desire for further weight loss. Concerns about potential impact of Mounjaro  on glaucoma and vision will be discussed with her ophthalmologist. A1c test is pending due to equipment failure, requiring a blood draw. Increased Mounjaro  to 7.5 mg. Ordered A1c test via blood draw. Discuss potential impact of Mounjaro  on glaucoma and vision with ophthalmologist. Monitor for adverse effects of increased Mounjaro  dose and adjust as necessary.  Obesity   Managed with Mounjaro  and lifestyle modifications. She lost 12 pounds over three months, indicating progress. Concerned about potential muscle loss with weight loss and the importance of maintaining muscle mass through exercise. Discussed benefits of weight loss on joint health and overall well-being. Continue Mounjaro  with increased dose to 7.5 mg. Encouraged regular exercise, including weightlifting, to maintain muscle mass. Discussed potential long-term use of Mounjaro  for weight management.     Return in about 3 months (around 06/20/2024) for DM.  Jenkins CHRISTELLA Carrel, MD     [1]  Current Outpatient Medications:    brimonidine (ALPHAGAN) 0.2 % ophthalmic solution, Apply to eye., Disp: , Rfl:    latanoprost (XALATAN) 0.005 % ophthalmic solution, Place 1 drop into both eyes nightly., Disp: , Rfl:    Multiple Vitamin (MULTIVITAMIN WITH MINERALS) TABS tablet, Take 1 tablet by mouth daily., Disp: , Rfl:    tirzepatide  (MOUNJARO ) 7.5 MG/0.5ML Pen, Inject 7.5 mg into the skin once a week., Disp: 2 mL, Rfl: 2 [2] No  Known Allergies  "

## 2024-03-24 ENCOUNTER — Ambulatory Visit: Payer: Self-pay | Admitting: Family Medicine

## 2024-03-24 NOTE — Progress Notes (Signed)
 A1C awesome!  Keep up the great work

## 2024-03-25 NOTE — Progress Notes (Signed)
Pt has read results.
